# Patient Record
Sex: Female | Born: 1945 | Race: Black or African American | Hispanic: No | Marital: Married | Smoking: Never smoker
Health system: Southern US, Community
[De-identification: ages and names within clinical notes are randomized; demographics above are authoritative.]

## PROBLEM LIST (undated history)

## (undated) DIAGNOSIS — I10 Essential (primary) hypertension: Secondary | ICD-10-CM

## (undated) HISTORY — PX: CHOLECYSTECTOMY: SHX55

## (undated) HISTORY — PX: ABDOMINAL HYSTERECTOMY: SHX81

---

## 2013-11-10 DIAGNOSIS — M810 Age-related osteoporosis without current pathological fracture: Secondary | ICD-10-CM | POA: Insufficient documentation

## 2013-11-10 DIAGNOSIS — M5136 Other intervertebral disc degeneration, lumbar region: Secondary | ICD-10-CM | POA: Insufficient documentation

## 2013-11-10 DIAGNOSIS — M47814 Spondylosis without myelopathy or radiculopathy, thoracic region: Secondary | ICD-10-CM | POA: Insufficient documentation

## 2014-02-02 DIAGNOSIS — K8020 Calculus of gallbladder without cholecystitis without obstruction: Secondary | ICD-10-CM | POA: Insufficient documentation

## 2014-02-02 DIAGNOSIS — H43819 Vitreous degeneration, unspecified eye: Secondary | ICD-10-CM | POA: Insufficient documentation

## 2014-02-02 DIAGNOSIS — H527 Unspecified disorder of refraction: Secondary | ICD-10-CM | POA: Insufficient documentation

## 2014-02-02 DIAGNOSIS — H40019 Open angle with borderline findings, low risk, unspecified eye: Secondary | ICD-10-CM | POA: Insufficient documentation

## 2014-02-02 DIAGNOSIS — IMO0002 Reserved for concepts with insufficient information to code with codable children: Secondary | ICD-10-CM

## 2014-02-02 DIAGNOSIS — L84 Corns and callosities: Secondary | ICD-10-CM | POA: Insufficient documentation

## 2014-02-02 DIAGNOSIS — B351 Tinea unguium: Secondary | ICD-10-CM | POA: Insufficient documentation

## 2014-02-02 DIAGNOSIS — M722 Plantar fascial fibromatosis: Secondary | ICD-10-CM | POA: Insufficient documentation

## 2014-02-02 DIAGNOSIS — H15109 Unspecified episcleritis, unspecified eye: Secondary | ICD-10-CM | POA: Insufficient documentation

## 2014-02-02 DIAGNOSIS — R079 Chest pain, unspecified: Secondary | ICD-10-CM | POA: Insufficient documentation

## 2014-02-02 DIAGNOSIS — M2040 Other hammer toe(s) (acquired), unspecified foot: Secondary | ICD-10-CM

## 2014-02-02 DIAGNOSIS — M25559 Pain in unspecified hip: Secondary | ICD-10-CM | POA: Insufficient documentation

## 2014-02-02 DIAGNOSIS — H2511 Age-related nuclear cataract, right eye: Secondary | ICD-10-CM | POA: Insufficient documentation

## 2014-02-02 HISTORY — DX: Age-related nuclear cataract, right eye: H25.11

## 2014-02-02 HISTORY — DX: Reserved for concepts with insufficient information to code with codable children: IMO0002

## 2014-02-02 HISTORY — DX: Other hammer toe(s) (acquired), unspecified foot: M20.40

## 2014-03-14 DIAGNOSIS — M1712 Unilateral primary osteoarthritis, left knee: Secondary | ICD-10-CM

## 2014-03-14 DIAGNOSIS — H43819 Vitreous degeneration, unspecified eye: Secondary | ICD-10-CM | POA: Insufficient documentation

## 2014-03-14 HISTORY — DX: Unilateral primary osteoarthritis, left knee: M17.12

## 2015-01-27 DIAGNOSIS — M5416 Radiculopathy, lumbar region: Secondary | ICD-10-CM | POA: Insufficient documentation

## 2015-01-27 HISTORY — DX: Radiculopathy, lumbar region: M54.16

## 2016-07-20 DIAGNOSIS — M4800 Spinal stenosis, site unspecified: Secondary | ICD-10-CM | POA: Insufficient documentation

## 2016-07-21 ENCOUNTER — Emergency Department (HOSPITAL_BASED_OUTPATIENT_CLINIC_OR_DEPARTMENT_OTHER): Payer: Medicare Other

## 2016-07-21 ENCOUNTER — Emergency Department (HOSPITAL_BASED_OUTPATIENT_CLINIC_OR_DEPARTMENT_OTHER)
Admission: EM | Admit: 2016-07-21 | Discharge: 2016-07-21 | Disposition: A | Discharge status: Home / Self Care | Payer: Medicare Other | Attending: Emergency Medicine | Admitting: Emergency Medicine

## 2016-07-21 ENCOUNTER — Encounter (HOSPITAL_BASED_OUTPATIENT_CLINIC_OR_DEPARTMENT_OTHER): Payer: Self-pay | Admitting: *Deleted

## 2016-07-21 DIAGNOSIS — Y92009 Unspecified place in unspecified non-institutional (private) residence as the place of occurrence of the external cause: Secondary | ICD-10-CM | POA: Diagnosis not present

## 2016-07-21 DIAGNOSIS — Y9301 Activity, walking, marching and hiking: Secondary | ICD-10-CM | POA: Diagnosis not present

## 2016-07-21 DIAGNOSIS — I10 Essential (primary) hypertension: Secondary | ICD-10-CM | POA: Diagnosis not present

## 2016-07-21 DIAGNOSIS — S0990XA Unspecified injury of head, initial encounter: Secondary | ICD-10-CM

## 2016-07-21 DIAGNOSIS — Y999 Unspecified external cause status: Secondary | ICD-10-CM | POA: Insufficient documentation

## 2016-07-21 DIAGNOSIS — W109XXA Fall (on) (from) unspecified stairs and steps, initial encounter: Secondary | ICD-10-CM | POA: Insufficient documentation

## 2016-07-21 DIAGNOSIS — Z7982 Long term (current) use of aspirin: Secondary | ICD-10-CM | POA: Diagnosis not present

## 2016-07-21 DIAGNOSIS — Z79899 Other long term (current) drug therapy: Secondary | ICD-10-CM | POA: Diagnosis not present

## 2016-07-21 DIAGNOSIS — S0083XA Contusion of other part of head, initial encounter: Secondary | ICD-10-CM | POA: Diagnosis not present

## 2016-07-21 DIAGNOSIS — W19XXXA Unspecified fall, initial encounter: Secondary | ICD-10-CM

## 2016-07-21 HISTORY — DX: Essential (primary) hypertension: I10

## 2016-07-21 NOTE — ED Triage Notes (Signed)
She slipped and fell this am while going up steps into her house. She hit her face on a brick wall breaking her glasses. Abrasion above her right eye. No LOC.

## 2016-07-21 NOTE — ED Provider Notes (Signed)
[facility]   CSN: [id number] Arrival date & time: 07/21/16  1306     History   Chief Complaint Chief Complaint  Patient presents with  . Fall  . Head Injury    HPI Shannon Hanson is a 71 y.o. female.  Pt reports to the ED with face and head pain s/p fall.  The pt said that she tripped going up the stairs, had something in her hands, and fell; hitting her face on the brick wall.  She had no loc.  She is not on blood thinners.  The glasses she was wearing broke.      Past Medical History:  Diagnosis Date  . Hypertension     There are no active problems to display for this patient.   Past Surgical History:  Procedure Laterality Date  . ABDOMINAL HYSTERECTOMY      OB History    No data available       Home Medications    Prior to Admission medications   Medication Sig Start Date End Date Taking? Authorizing Provider  Aspirin (ASPIR-81 PO) Take by mouth.   Yes Historical Provider, MD  Calcium Carbonate-Vitamin D (CALCIUM 500 + D) 500-125 MG-UNIT TABS Take by mouth.   Yes Historical Provider, MD  FIBER COMPLETE PO Take by mouth.   Yes Historical Provider, MD  HYDROCHLOROTHIAZIDE PO Take by mouth.   Yes Historical Provider, MD  metoprolol (LOPRESSOR) 50 MG tablet Take 50 mg by mouth 2 (two) times daily.   Yes Historical Provider, MD  Potassium (POTASSIMIN PO) Take by mouth.   Yes Historical Provider, MD    Family History No family history on file.  Social History Social History  Substance Use Topics  . Smoking status: Never Smoker  . Smokeless tobacco: Never Used  . Alcohol use No     Allergies   Shellfish allergy   Review of Systems Review of Systems  HENT: Positive for facial swelling.   Neurological: Positive for headaches.  All other systems reviewed and are negative.    Physical Exam Updated Vital Signs BP 141/75   Pulse (!) 55   Temp 97.7 F (36.5 C) (Oral)   Resp 20   Ht 5\' 7"  (1.702 m)   Wt 191 lb (86.6  kg)   SpO2 99%   BMI 29.91 kg/m   Physical Exam  Constitutional: She is oriented to person, place, and time. She appears well-developed and well-nourished.  HENT:  Head: Normocephalic.    Right Ear: External ear normal.  Left Ear: External ear normal.  Nose: Nose normal.  Mouth/Throat: Oropharynx is clear and moist.  Eyes: Conjunctivae and EOM are normal. Pupils are equal, round, and reactive to light.  Neck: Normal range of motion. Neck supple.  Cardiovascular: Normal rate, regular rhythm, normal heart sounds and intact distal pulses.   Pulmonary/Chest: Effort normal and breath sounds normal.  Abdominal: Soft. Bowel sounds are normal.  Musculoskeletal: Normal range of motion.  Neurological: She is alert and oriented to person, place, and time.  Skin: Skin is warm.  Psychiatric: She has a normal mood and affect. Her behavior is normal. Judgment and thought content normal.  Nursing note and vitals reviewed.    ED Treatments / Results  Labs (all labs ordered are listed, but only abnormal results are displayed) Labs Reviewed - No data to display  EKG  EKG Interpretation None       Radiology Ct Head Wo Contrast  Result Date: 07/21/2016 CLINICAL DATA:  Stomal walking in 2 her home hitting right eye on brick wall. EXAM: CT HEAD WITHOUT CONTRAST CT MAXILLOFACIAL WITHOUT CONTRAST TECHNIQUE: Multidetector CT imaging of the head and maxillofacial structures were performed using the standard protocol without intravenous contrast. Multiplanar CT image reconstructions of the maxillofacial structures were also generated. COMPARISON:  None. FINDINGS: CT HEAD FINDINGS Brain: The ventricles, cisterns and other CSF spaces are within normal. There is no mass, mass effect, shift of midline structures or acute hemorrhage. There is no evidence of acute infarction. Vascular: Mild calcified plaque over the cavernous segment of the internal carotid arteries. Skull: Within normal. Other: None. CT  MAXILLOFACIAL FINDINGS Osseous: No evidence of fracture. Orbits: Globes and retrobulbar spaces are normal and symmetric. Subtle soft tissue swelling over the right periorbital region. Sinuses: Paranasal sinuses are well developed and well aerated without air-fluid level. Ostiomeatal complexes are patent. Visualized mastoid air cells are clear. Soft tissues: Minimal soft tissue swelling over the right periorbital region IMPRESSION: No acute intracranial findings. No acute facial bone fracture. Minimal soft tissue swelling over the right periorbital region. Electronically Signed   By: Elberta Fortisaniel  Boyle M.D.   On: 07/21/2016 14:30   Ct Maxillofacial Wo Contrast  Result Date: 07/21/2016 CLINICAL DATA:  Stomal walking in 2 her home hitting right eye on brick wall. EXAM: CT HEAD WITHOUT CONTRAST CT MAXILLOFACIAL WITHOUT CONTRAST TECHNIQUE: Multidetector CT imaging of the head and maxillofacial structures were performed using the standard protocol without intravenous contrast. Multiplanar CT image reconstructions of the maxillofacial structures were also generated. COMPARISON:  None. FINDINGS: CT HEAD FINDINGS Brain: The ventricles, cisterns and other CSF spaces are within normal. There is no mass, mass effect, shift of midline structures or acute hemorrhage. There is no evidence of acute infarction. Vascular: Mild calcified plaque over the cavernous segment of the internal carotid arteries. Skull: Within normal. Other: None. CT MAXILLOFACIAL FINDINGS Osseous: No evidence of fracture. Orbits: Globes and retrobulbar spaces are normal and symmetric. Subtle soft tissue swelling over the right periorbital region. Sinuses: Paranasal sinuses are well developed and well aerated without air-fluid level. Ostiomeatal complexes are patent. Visualized mastoid air cells are clear. Soft tissues: Minimal soft tissue swelling over the right periorbital region IMPRESSION: No acute intracranial findings. No acute facial bone fracture.  Minimal soft tissue swelling over the right periorbital region. Electronically Signed   By: Elberta Fortisaniel  Boyle M.D.   On: 07/21/2016 14:30    Procedures Procedures (including critical care time)  Medications Ordered in ED Medications - No data to display   Initial Impression / Assessment and Plan / ED Course  I have reviewed the triage vital signs and the nursing notes.  Pertinent labs & imaging results that were available during my care of the patient were reviewed by me and considered in my medical decision making (see chart for details).    Pt looks good.  She is stable for d/c.  She knows to return if worse.  Final Clinical Impressions(s) / ED Diagnoses   Final diagnoses:  Injury of head, initial encounter  Contusion of face, initial encounter  Fall in home, initial encounter    New Prescriptions New Prescriptions   No medications on file     Jacalyn LefevreJulie Antoinne Spadaccini, MD 07/21/16 1439

## 2016-07-21 NOTE — ED Notes (Signed)
ED Provider at bedside. 

## 2016-10-22 ENCOUNTER — Encounter (HOSPITAL_BASED_OUTPATIENT_CLINIC_OR_DEPARTMENT_OTHER): Payer: Self-pay

## 2016-10-22 ENCOUNTER — Emergency Department (HOSPITAL_BASED_OUTPATIENT_CLINIC_OR_DEPARTMENT_OTHER)
Admission: EM | Admit: 2016-10-22 | Discharge: 2016-10-23 | Disposition: A | Discharge status: Home / Self Care | Payer: Medicare Other | Attending: Emergency Medicine | Admitting: Emergency Medicine

## 2016-10-22 ENCOUNTER — Emergency Department (HOSPITAL_BASED_OUTPATIENT_CLINIC_OR_DEPARTMENT_OTHER): Payer: Medicare Other

## 2016-10-22 DIAGNOSIS — R1031 Right lower quadrant pain: Secondary | ICD-10-CM | POA: Diagnosis present

## 2016-10-22 DIAGNOSIS — I10 Essential (primary) hypertension: Secondary | ICD-10-CM | POA: Insufficient documentation

## 2016-10-22 DIAGNOSIS — Z7982 Long term (current) use of aspirin: Secondary | ICD-10-CM | POA: Diagnosis not present

## 2016-10-22 DIAGNOSIS — K2900 Acute gastritis without bleeding: Secondary | ICD-10-CM | POA: Insufficient documentation

## 2016-10-22 LAB — COMPREHENSIVE METABOLIC PANEL
ALBUMIN: 4.6 g/dL (ref 3.5–5.0)
ALT: 19 U/L (ref 14–54)
ANION GAP: 12 (ref 5–15)
AST: 33 U/L (ref 15–41)
Alkaline Phosphatase: 78 U/L (ref 38–126)
BUN: 19 mg/dL (ref 6–20)
CO2: 26 mmol/L (ref 22–32)
Calcium: 9.7 mg/dL (ref 8.9–10.3)
Chloride: 102 mmol/L (ref 101–111)
Creatinine, Ser: 0.84 mg/dL (ref 0.44–1.00)
GFR calc Af Amer: >60 mL/min (ref >60)
GFR calc non Af Amer: >60 mL/min (ref >60)
GLUCOSE: 138 mg/dL — AB (ref 65–99)
POTASSIUM: 3.4 mmol/L — AB (ref 3.5–5.1)
SODIUM: 140 mmol/L (ref 135–145)
Total Bilirubin: 1.1 mg/dL (ref 0.3–1.2)
Total Protein: 7.9 g/dL (ref 6.5–8.1)

## 2016-10-22 LAB — CBC WITH DIFFERENTIAL/PLATELET
BASOS ABS: 0 10*3/uL (ref 0.0–0.1)
BASOS PCT: 0 %
EOS ABS: 0 10*3/uL (ref 0.0–0.7)
Eosinophils Relative: 0 %
HCT: 40.2 % (ref 36.0–46.0)
HEMOGLOBIN: 14.5 g/dL (ref 12.0–15.0)
Lymphocytes Relative: 4 %
Lymphs Abs: 0.4 10*3/uL — ABNORMAL LOW (ref 0.7–4.0)
MCH: 29.7 pg (ref 26.0–34.0)
MCHC: 36.1 g/dL — AB (ref 30.0–36.0)
MCV: 82.4 fL (ref 78.0–100.0)
MONO ABS: 0.4 10*3/uL (ref 0.1–1.0)
MONOS PCT: 3 %
NEUTROS PCT: 93 %
Neutro Abs: 10.2 10*3/uL — ABNORMAL HIGH (ref 1.7–7.7)
Platelets: 270 10*3/uL (ref 150–400)
RBC: 4.88 MIL/uL (ref 3.87–5.11)
RDW: 13.7 % (ref 11.5–15.5)
WBC: 11 10*3/uL — ABNORMAL HIGH (ref 4.0–10.5)

## 2016-10-22 LAB — LIPASE, BLOOD: Lipase: 21 U/L (ref 11–51)

## 2016-10-22 MED ORDER — ONDANSETRON HCL 4 MG/2ML IJ SOLN
4.0000 mg | Freq: Once | INTRAMUSCULAR | Status: AC
  Administered 2016-10-22: 4 mg via INTRAVENOUS
  Filled 2016-10-22: qty 2

## 2016-10-22 MED ORDER — SODIUM CHLORIDE 0.9 % IV BOLUS (SEPSIS)
1000.0000 mL | Freq: Once | INTRAVENOUS | Status: AC
  Administered 2016-10-22: 1000 mL via INTRAVENOUS

## 2016-10-22 MED ORDER — IOPAMIDOL (ISOVUE-300) INJECTION 61%
100.0000 mL | Freq: Once | INTRAVENOUS | Status: AC | PRN
  Administered 2016-10-23: 100 mL via INTRAVENOUS

## 2016-10-22 NOTE — ED Provider Notes (Signed)
11:12 PM I was signed out patient as pending CT for evaluation of RLQ abdominal pain.  Labs are unremarkable.  CT is pending.   1:10 AM CT is negative for acute pathology.  Repeat abdominal exam does not reveal any tenderness.  Patient states the zofran took away her nausea and pain.  Perhaps this is a gastritis from something she ate.  Will DC home with zofran to take as needed.  PCP fu advised within 3 days. She demonstrates good understanding of the plan. She appears well and in NAD. VS remain within her normal limits and she is safe for Dc.   Tomasita Crumble, MD 10/23/16 0111

## 2016-10-22 NOTE — ED Triage Notes (Addendum)
Pt reports nausea, vomiting since 1600 with associated generalized abdominal discomfort, worsening tenderness to RLQ. NPO since 1600.

## 2016-10-22 NOTE — ED Notes (Addendum)
Back to room by w/c. Alert, NAD, calm, interactive, resps e/u, no dyspnea noted. Family x4 at [facility].

## 2016-10-22 NOTE — ED Notes (Signed)
Pt up to w/c to go to b/r to attempt urine sample. EDP into room, prior to RN assessment, see MD notes, pending orders.

## 2016-10-22 NOTE — ED Provider Notes (Signed)
[facility]   CSN: [id number] Arrival date & time: 10/22/16  2154   By signing my name below, I, Teofilo Pod, attest that this documentation has been prepared under the direction and in the presence of Gwyneth Sprout, MD . Electronically Signed: Teofilo Pod, ED Scribe. 10/22/2016. 10:21 PM.   History   Chief Complaint Chief Complaint  Patient presents with  . Emesis    The history is provided by the patient. No language interpreter was used.   HPI Comments:  Shannon Hanson is a 71 y.o. female who presents to the Emergency Department complaining of multiple episodes of vomiting today. Pt reports that she has vomited 6 times since 1600 today. She has not eaten or had fluids since this morning. Pt complains of associated nausea, abdominal pain the is primarily on the right side radiating across her abdomen. Pt had an endoscopy 2 months ago and was told that she had irritation in her stomach. She reports sick contact with a family member who had a stomach virus last week. She reports surgical hx of hysterectomy. She denies any recent medication changes, and she takes nexium regularly. She reports an allergy to gabapentin. No alleviating factors noted. Denies diarrhea, fever, urinary symptoms, SOB, back pain.    Past Medical History:  Diagnosis Date  . Hypertension     There are no active problems to display for this patient.   Past Surgical History:  Procedure Laterality Date  . ABDOMINAL HYSTERECTOMY      OB History    No data available       Home Medications    Prior to Admission medications   Medication Sig Start Date End Date Taking? Authorizing Provider  Aspirin (ASPIR-81 PO) Take by mouth.   Yes Historical Provider, MD  Calcium Carbonate-Vitamin D (CALCIUM 500 + D) 500-125 MG-UNIT TABS Take by mouth.   Yes Historical Provider, MD  FIBER COMPLETE PO Take by mouth.   Yes Historical Provider, MD  HYDROCHLOROTHIAZIDE PO Take by  mouth.   Yes Historical Provider, MD  metoprolol (LOPRESSOR) 50 MG tablet Take 50 mg by mouth 2 (two) times daily.   Yes Historical Provider, MD  Potassium (POTASSIMIN PO) Take by mouth.   Yes Historical Provider, MD    Family History History reviewed. No pertinent family history.  Social History Social History  Substance Use Topics  . Smoking status: Never Smoker  . Smokeless tobacco: Never Used  . Alcohol use No     Allergies   Gabapentin; Lyrica [pregabalin]; and Shellfish allergy   Review of Systems Review of Systems All systems reviewed and are negative for acute change except as noted in the HPI.   Physical Exam Updated Vital Signs BP 124/62 (BP Location: Right Arm)   Pulse 71   Temp 97.9 F (36.6 C) (Oral)   Resp 19   SpO2 100%   Physical Exam  Constitutional: She appears well-developed and well-nourished. No distress.  HENT:  Head: Normocephalic and atraumatic.  Dry mucous membranes.  Eyes: Conjunctivae are normal.  Neck: Neck supple.  Cardiovascular: Normal rate and regular rhythm.   No murmur heard. Pulmonary/Chest: Effort normal and breath sounds normal. No respiratory distress.  Abdominal: Soft. She exhibits no distension. There is tenderness. There is no rebound and no guarding.  RLQ pain without rebound or guarding. Decreased bowel sounds. No CVA tenderness.  Musculoskeletal: She exhibits no edema.  Neurological: She is alert.  Skin: Skin is warm and dry.  Psychiatric:  She has a normal mood and affect.  Nursing note and vitals reviewed.    ED Treatments / Results  DIAGNOSTIC STUDIES:  Oxygen Saturation is 100% on RA, normal by my interpretation.    COORDINATION OF CARE:  10:20 PM Discussed treatment plan with pt at bedside and pt agreed to plan.   Labs (all labs ordered are listed, but only abnormal results are displayed) Labs Reviewed - No data to display  EKG  EKG Interpretation None       Radiology No results  found.  Procedures Procedures (including critical care time)  Medications Ordered in ED Medications - No data to display   Initial Impression / Assessment and Plan / ED Course  I have reviewed the triage vital signs and the nursing notes.  Pertinent labs & imaging results that were available during my care of the patient were reviewed by me and considered in my medical decision making (see chart for details).     Patient is a 71 year old female presenting today with sudden onset of nausea vomiting around 3:00 this afternoon. She has vomited approximately 6 times and feels generally weak and worn out. She is also develop pain in the right side of her abdomen that radiates to the left. She denies any cardiac or respiratory symptoms.  No fever or diarrhea. Vital signs are within normal limits. She denies any urinary symptoms. On exam she does have right lower quadrant pain without rebound or guarding, decreased bowel sounds but no CVA tenderness. Possibly kidney stone versus small bowel obstruction versus appendicitis versus viral etiology. Patient given IV fluids and Zofran. CBC, CMP, lipase, urine pending. Patient will most likely need a CT scan performed for the evaluation.  Pt checked out to Dr. Mora Bellman  Final Clinical Impressions(s) / ED Diagnoses   Final diagnoses:  None    New Prescriptions New Prescriptions   No medications on file   I personally performed the services described in this documentation, which was scribed in my presence.  The recorded information has been reviewed and considered.     Gwyneth Sprout, MD 10/25/16 [phone]

## 2016-10-23 DIAGNOSIS — K2900 Acute gastritis without bleeding: Secondary | ICD-10-CM | POA: Diagnosis not present

## 2016-10-23 MED ORDER — ONDANSETRON 4 MG PO TBDP: 4.0000 mg | ORAL_TABLET | Freq: Three times a day (TID) | ORAL | 0 refills | Status: DC | PRN

## 2016-10-23 NOTE — ED Notes (Signed)
Back from CT, no changes, alert, NAD, calm.  ?

## 2016-10-23 NOTE — ED Notes (Signed)
Pt in to CT, no changes, alert, NAD, calm, VSS.

## 2016-10-23 NOTE — ED Notes (Signed)
Dr. Mora Bellman in to see and update pt/family.

## 2016-10-23 NOTE — ED Notes (Signed)
Resting comfortably, NAD, calm, tolerating PO contrast, denies nausea, mentions some "mild" abd discomfort, and rates a 6/10. VSS. Family x3 at [facility].

## 2017-10-30 ENCOUNTER — Other Ambulatory Visit: Payer: Self-pay

## 2017-10-30 ENCOUNTER — Encounter (HOSPITAL_BASED_OUTPATIENT_CLINIC_OR_DEPARTMENT_OTHER): Payer: Self-pay | Admitting: *Deleted

## 2017-10-30 DIAGNOSIS — R1013 Epigastric pain: Secondary | ICD-10-CM | POA: Diagnosis not present

## 2017-10-30 DIAGNOSIS — R1011 Right upper quadrant pain: Secondary | ICD-10-CM | POA: Diagnosis present

## 2017-10-30 DIAGNOSIS — R112 Nausea with vomiting, unspecified: Secondary | ICD-10-CM | POA: Insufficient documentation

## 2017-10-30 DIAGNOSIS — I10 Essential (primary) hypertension: Secondary | ICD-10-CM | POA: Diagnosis not present

## 2017-10-30 DIAGNOSIS — Z7982 Long term (current) use of aspirin: Secondary | ICD-10-CM | POA: Insufficient documentation

## 2017-10-30 LAB — CBC
HCT: 40.4 % (ref 36.0–46.0)
HEMOGLOBIN: 15 g/dL (ref 12.0–15.0)
MCH: 29.9 pg (ref 26.0–34.0)
MCHC: 37.1 g/dL — AB (ref 30.0–36.0)
MCV: 80.6 fL (ref 78.0–100.0)
PLATELETS: 273 10*3/uL (ref 150–400)
RBC: 5.01 MIL/uL (ref 3.87–5.11)
RDW: 13.7 % (ref 11.5–15.5)
WBC: 7.4 10*3/uL (ref 4.0–10.5)

## 2017-10-30 LAB — COMPREHENSIVE METABOLIC PANEL
ALT: 14 U/L (ref 14–54)
ANION GAP: 11 (ref 5–15)
AST: 29 U/L (ref 15–41)
Albumin: 4.7 g/dL (ref 3.5–5.0)
Alkaline Phosphatase: 79 U/L (ref 38–126)
BILIRUBIN TOTAL: 1.3 mg/dL — AB (ref 0.3–1.2)
BUN: 16 mg/dL (ref 6–20)
CALCIUM: 9.7 mg/dL (ref 8.9–10.3)
CO2: 27 mmol/L (ref 22–32)
CREATININE: 0.69 mg/dL (ref 0.44–1.00)
Chloride: 101 mmol/L (ref 101–111)
GFR calc non Af Amer: >60 mL/min (ref >60)
GLUCOSE: 109 mg/dL — AB (ref 65–99)
Potassium: 3.6 mmol/L (ref 3.5–5.1)
Sodium: 139 mmol/L (ref 135–145)
TOTAL PROTEIN: 7.8 g/dL (ref 6.5–8.1)

## 2017-10-30 LAB — LIPASE, BLOOD: Lipase: 25 U/L (ref 11–51)

## 2017-10-30 MED ORDER — ONDANSETRON 4 MG PO TBDP
4.0000 mg | ORAL_TABLET | Freq: Once | ORAL | Status: AC
  Administered 2017-10-30: 4 mg via ORAL
  Filled 2017-10-30: qty 1

## 2017-10-30 NOTE — ED Triage Notes (Signed)
Abdominal and back pain with vomiting since this am. States last year she had the same thing as today and she had an endoscopy that showed inflammation in her stomach lining. She took Nexium and got better.

## 2017-10-31 ENCOUNTER — Emergency Department (HOSPITAL_BASED_OUTPATIENT_CLINIC_OR_DEPARTMENT_OTHER)
Admission: EM | Admit: 2017-10-31 | Discharge: 2017-10-31 | Disposition: A | Discharge status: Home / Self Care | Payer: Medicare Other | Attending: Emergency Medicine | Admitting: Emergency Medicine

## 2017-10-31 DIAGNOSIS — R1013 Epigastric pain: Secondary | ICD-10-CM

## 2017-10-31 DIAGNOSIS — R1115 Cyclical vomiting syndrome unrelated to migraine: Secondary | ICD-10-CM

## 2017-10-31 LAB — URINALYSIS, ROUTINE W REFLEX MICROSCOPIC
BILIRUBIN URINE: NEGATIVE
Glucose, UA: NEGATIVE mg/dL
Hgb urine dipstick: NEGATIVE
Ketones, ur: 15 mg/dL — AB
Nitrite: NEGATIVE
PH: 8 (ref 5.0–8.0)
Protein, ur: 100 mg/dL — AB
SPECIFIC GRAVITY, URINE: 1.02 (ref 1.005–1.030)

## 2017-10-31 LAB — URINALYSIS, MICROSCOPIC (REFLEX)

## 2017-10-31 MED ORDER — ONDANSETRON 8 MG PO TBDP: 8.0000 mg | ORAL_TABLET | Freq: Three times a day (TID) | ORAL | 0 refills | Status: DC | PRN

## 2017-10-31 NOTE — Discharge Instructions (Addendum)
Please call your primary doctor later today.  You may benefit from an ultrasound of your upper abdomen in your gallbladder.  SEEK IMMEDIATE MEDICAL ATTENTION IF: The pain does not go away or becomes severe, particularly over the next 8-12 hours.  A temperature above 100.23F develops.  Repeated vomiting occurs (multiple episodes).  The pain becomes localized to portions of the abdomen. The right side could possibly be appendicitis. In an adult, the left lower portion of the abdomen could be colitis or diverticulitis.  Blood is being passed in stools or vomit (bright red or black tarry stools).  Return also if you develop chest pain, difficulty breathing, dizziness or fainting, or become confused, poorly responsive, or inconsolable.

## 2017-10-31 NOTE — ED Notes (Signed)
Pt unable to provide urine sample at this time 

## 2017-10-31 NOTE — ED Provider Notes (Signed)
[facility] EMERGENCY DEPARTMENT Provider Note   CSN: [id number] Arrival date & time: 10/30/17  2058     History   Chief Complaint Chief Complaint  Patient presents with  . Abdominal Pain    HPI Shannon Hanson is a 72 y.o. female.  The history is provided by the patient.  Abdominal Pain   This is a new problem. The current episode started 12 to 24 hours ago. The problem occurs constantly. The problem has been gradually improving. The pain is located in the epigastric region and RUQ. The pain is severe. Associated symptoms include nausea and vomiting. Pertinent negatives include fever, diarrhea, hematochezia, melena, constipation and dysuria. The symptoms are aggravated by eating. Relieved by: Zofran. Past workup includes GI consult and CT scan.  History of hypertension presents with abdominal pain and vomiting.  She reports multiple episodes of nonbloody emesis.  She also reports upper abdominal pain.  She reports the pain does radiate into her back at times No chest pain or shortness of breath is reported. No bloody or dark stools.  She has had normal bowel movements. Patient reports last year she had a similar episode, and had a followup with GI and was told that she had "stomach inflammation" She does report previous endoscopy  Past Medical History:  Diagnosis Date  . Hypertension     There are no active problems to display for this patient.   Past Surgical History:  Procedure Laterality Date  . ABDOMINAL HYSTERECTOMY       OB History   None      Home Medications    Prior to Admission medications   Medication Sig Start Date End Date Taking? Authorizing Provider  Aspirin (ASPIR-81 PO) Take by mouth.   Yes [provider]  Calcium Carbonate-Vitamin D (CALCIUM 500 + D) 500-125 MG-UNIT TABS Take by mouth.   Yes [provider]  FIBER COMPLETE PO Take by mouth.   Yes [provider]  HYDROCHLOROTHIAZIDE PO Take by mouth.   Yes  [provider]  metoprolol (LOPRESSOR) 50 MG tablet Take 50 mg by mouth 2 (two) times daily.   Yes [provider]  Potassium (POTASSIMIN PO) Take by mouth.   Yes [provider]  ondansetron (ZOFRAN ODT) 8 MG disintegrating tablet Take 1 tablet (8 mg total) by mouth every 8 (eight) hours as needed. 10/31/17   Zadie Rhine, MD    Family History No family history on file.  Social History Social History   Tobacco Use  . Smoking status: Never Smoker  . Smokeless tobacco: Never Used  Substance Use Topics  . Alcohol use: No  . Drug use: Not on file     Allergies   Gabapentin; Lyrica [pregabalin]; and Shellfish allergy   Review of Systems Review of Systems  Constitutional: Negative for fever.  Respiratory: Negative for shortness of breath.   Cardiovascular: Negative for chest pain.  Gastrointestinal: Positive for abdominal pain, nausea and vomiting. Negative for blood in stool, constipation, diarrhea, hematochezia and melena.  Genitourinary: Negative for difficulty urinating and dysuria.  All other systems reviewed and are negative.    Physical Exam Updated Vital Signs BP (!) 169/89   Pulse 64   Temp 98.3 F (36.8 C)   Resp 18   Ht 1.702 m ( )   Wt 72.6 kg (160 lb)   SpO2 99%   BMI 25.06 kg/m   Physical Exam CONSTITUTIONAL: Elderly, well-appearing, no acute distress HEAD: Normocephalic/atraumatic EYES: EOMI/PERRL, no icterus ENMT:  Mucous membranes moist NECK: supple no meningeal signs SPINE/BACK:entire spine nontender CV: S1/S2 noted, no murmurs/rubs/gallops noted LUNGS: Lungs are clear to auscultation bilaterally, no apparent distress ABDOMEN: soft, mild epigastric tenderness, no rebound or guarding, bowel sounds noted throughout abdomen GU:no cva tenderness NEURO: Pt is awake/alert/appropriate, moves all extremitiesx4.  EXTREMITIES:, full ROM SKIN: warm, color normal PSYCH: no abnormalities of mood noted, alert and oriented  to situation    ED Treatments / Results  Labs (all labs ordered are listed, but only abnormal results are displayed) Labs Reviewed  COMPREHENSIVE METABOLIC PANEL - Abnormal; Notable for the following components:      Result Value   Glucose, Bld 109 (*)    Total Bilirubin 1.3 (*)    All other components within normal limits  CBC - Abnormal; Notable for the following components:   MCHC 37.1 (*)    All other components within normal limits  URINALYSIS, ROUTINE W REFLEX MICROSCOPIC - Abnormal; Notable for the following components:   Ketones, ur 15 (*)    Protein, ur 100 (*)    Leukocytes, UA SMALL (*)    All other components within normal limits  URINALYSIS, MICROSCOPIC (REFLEX) - Abnormal; Notable for the following components:   Bacteria, UA MANY (*)    All other components within normal limits  LIPASE, BLOOD    EKG None  Radiology No results found.  Procedures Procedures (including critical care time)  Medications Ordered in ED Medications  ondansetron (ZOFRAN-ODT) disintegrating tablet 4 mg (4 mg Oral Given 10/30/17 2301)     Initial Impression / Assessment and Plan / ED Course  I have reviewed the triage vital signs and the nursing notes.  Pertinent labs results that were available during my care of the patient were reviewed by me and considered in my medical decision making (see chart for details).     Patient presents with abdominal pain and vomiting. By the time of my evaluation patient was feeling improved. She is awake alert, no distress.  No further vomiting after receiving Zofran. She prefers to go home, she feels this is similar to previous episodes.  She plans to restrict her diet and follow-up closely with her primary doctor. She requests Zofran at discharge.   We discussed strict ER return precautions including  fever >100.29F with repetitive vomiting over next 8-12 hours   I told her that she may benefit from having right upper quadrant abdominal  ultrasound by her PCP Final Clinical Impressions(s) / ED Diagnoses   Final diagnoses:  Epigastric pain  Non-intractable cyclical vomiting with nausea    ED Discharge Orders        Ordered    ondansetron (ZOFRAN ODT) 8 MG disintegrating tablet  Every 8 hours PRN     10/31/17 0312       Zadie Rhine, MD 10/31/17 [phone]

## 2018-01-31 DIAGNOSIS — Z9049 Acquired absence of other specified parts of digestive tract: Secondary | ICD-10-CM

## 2018-01-31 HISTORY — DX: Acquired absence of other specified parts of digestive tract: Z90.49

## 2019-05-30 ENCOUNTER — Encounter: Payer: Self-pay | Admitting: Cardiology

## 2019-06-17 ENCOUNTER — Other Ambulatory Visit: Payer: Self-pay

## 2019-06-19 ENCOUNTER — Other Ambulatory Visit: Payer: Self-pay

## 2019-06-19 ENCOUNTER — Ambulatory Visit (INDEPENDENT_AMBULATORY_CARE_PROVIDER_SITE_OTHER): Payer: Medicare Other | Admitting: Cardiology

## 2019-06-19 ENCOUNTER — Encounter: Payer: Self-pay | Admitting: Cardiology

## 2019-06-19 VITALS — BP 168/84 | HR 50 | Ht 67.0 in | Wt 175.0 lb

## 2019-06-19 DIAGNOSIS — R0789 Other chest pain: Secondary | ICD-10-CM | POA: Insufficient documentation

## 2019-06-19 DIAGNOSIS — R011 Cardiac murmur, unspecified: Secondary | ICD-10-CM

## 2019-06-19 DIAGNOSIS — I259 Chronic ischemic heart disease, unspecified: Secondary | ICD-10-CM

## 2019-06-19 DIAGNOSIS — I209 Angina pectoris, unspecified: Secondary | ICD-10-CM

## 2019-06-19 DIAGNOSIS — I495 Sick sinus syndrome: Secondary | ICD-10-CM | POA: Insufficient documentation

## 2019-06-19 DIAGNOSIS — R0989 Other specified symptoms and signs involving the circulatory and respiratory systems: Secondary | ICD-10-CM | POA: Diagnosis not present

## 2019-06-19 DIAGNOSIS — I10 Essential (primary) hypertension: Secondary | ICD-10-CM | POA: Insufficient documentation

## 2019-06-19 MED ORDER — AMLODIPINE BESYLATE 10 MG PO TABS: 10.0000 mg | ORAL_TABLET | Freq: Every day | ORAL | 3 refills | Status: DC

## 2019-06-19 MED ORDER — NITROGLYCERIN 0.4 MG SL SUBL: 0.4000 mg | SUBLINGUAL_TABLET | SUBLINGUAL | 3 refills | Status: DC | PRN

## 2019-06-19 NOTE — Progress Notes (Signed)
Cardiology Office Note:    Date:  06/19/2019   ID:  Shannon Hanson, DOB 10-Apr-1946, MRN [id number]  PCP:  Forrest Moron, MD  Cardiologist:  Garwin Brothers, MD   Referring MD: Forrest Moron, MD    ASSESSMENT:    1. Chest pain due to myocardial ischemia, unspecified ischemic chest pain type   2. Angina pectoris (HCC)   3. Renal bruit   4. Murmur   5. Chest discomfort   6. Essential hypertension   7. Tachycardia-bradycardia syndrome (HCC)   8. Abdominal bruit   9. Cardiac murmur    PLAN:    In order of problems listed above:  1. Chest discomfort: Primary prevention stressed with the patient.  Importance of compliance with diet and medication stressed and she vocalized understanding.  She has multiple risk factors for coronary artery disease although her chest pain is atypical we will do a Lexiscan sestamibi.  She knows to go to the nearest emergency room for any concerning symptoms.  Sublingual nitroglycerin prescription was sent, its protocol and 911 protocol explained and the patient vocalized understanding questions were answered to the patient's satisfaction 2. Essential hypertension: Blood pressure still elevated and continues to be so at home also we will double amlodipine to 10 mg daily.  After reviewing [facility] monitoring reports I will further titrate her medications.  Dietary issues were discussed with her at extensive length including salt intake and she promises to comply.  Echocardiogram will be done to assess murmur heard on auscultation. 3. Patient has abdominal bruit and we will do bilateral renal arterial Doppler to rule out stenosis in view of uncontrolled hypertension. 4. Patient will be seen in follow-up appointment in a month or earlier if she has any concerns.   Medication Adjustments/Labs and Tests Ordered: Current medicines are reviewed at length with the patient today.  Concerns regarding medicines are outlined above.  Orders Placed This Encounter    Procedures  . MYOCARDIAL PERFUSION IMAGING  . ECHOCARDIOGRAM COMPLETE  . VAS US RENAL ARTERY DUPLEX   Meds ordered this encounter  Medications  . nitroGLYCERIN (NITROSTAT) 0.4 MG SL tablet    Sig: Place 1 tablet (0.4 mg total) under the tongue every 5 (five) minutes as needed.    Dispense:  25 tablet    Refill:  3  . amLODipine (NORVASC) 10 MG tablet    Sig: Take 1 tablet (10 mg total) by mouth daily.    Dispense:  180 tablet    Refill:  3     History of Present Illness:    Shannon Hanson is a 73 y.o. female who is being seen today for the evaluation of chest discomfort and essential hypertension at the request of Forrest Moron, MD.  Patient is a pleasant 73 year old female.  She has past medical history of essential hypertension.  She is on multiple medications and her blood pressure is challenging to control.  She was undergoing monitoring which revealed bradycardia and tachyarrhythmias.  I do not have access to those reports and I am trying to get them from primary care physician's office.  She complains of chest discomfort at times not related to exertion.  He does not exercise on a regular basis.  Her chest discomfort does not radiate to the neck or to the arms.  At the time of my evaluation, the patient is alert awake oriented and in no distress.  Past Medical History:  Diagnosis Date  . Hypertension     Past Surgical History:  Procedure  Laterality Date  . ABDOMINAL HYSTERECTOMY      Current Medications: Current Meds  Medication Sig  . Aspirin (ASPIR-81 PO) Take by mouth.  . Calcium Carbonate-Vitamin D (CALCIUM 500 + D) 500-125 MG-UNIT TABS Take by mouth.  . cloNIDine (CATAPRES) 0.2 MG tablet Take 0.2 mg by mouth 2 (two) times daily.  Marland Kitchen. FIBER COMPLETE PO Take by mouth.  . fluticasone (FLONASE) 50 MCG/ACT nasal spray SMARTSIG:1 Spray(s) Both Nares 1 to 2 Times Daily PRN  . losartan (COZAAR) 100 MG tablet Take 100 mg by mouth at bedtime.  . Potassium (POTASSIMIN PO) Take  by mouth.  . timolol (TIMOPTIC) 0.5 % ophthalmic solution 1 drop 2 (two) times daily.  Marland Kitchen. triamterene-hydrochlorothiazide (MAXZIDE-25) 37.5-25 MG tablet Take 1 tablet by mouth daily.  . [DISCONTINUED] amLODipine (NORVASC) 5 MG tablet Take 5 mg by mouth daily.     Allergies:   Pregabalin, Shellfish allergy, and Gabapentin   Social History   Socioeconomic History  . Marital status: Married    Spouse name: Not on file  . Number of children: Not on file  . Years of education: Not on file  . Highest education level: Not on file  Occupational History  . Not on file  Tobacco Use  . Smoking status: Never Smoker  . Smokeless tobacco: Never Used  Substance and Sexual Activity  . Alcohol use: No  . Drug use: Never  . Sexual activity: Never  Other Topics Concern  . Not on file  Social History Narrative  . Not on file   Social Determinants of Health   Financial Resource Strain:   . Difficulty of Paying Living Expenses: Not on file  Food Insecurity:   . Worried About Programme researcher, broadcasting/film/videounning Out of Food in the Last Year: Not on file  . Ran Out of Food in the Last Year: Not on file  Transportation Needs:   . Lack of Transportation (Medical): Not on file  . Lack of Transportation (Non-Medical): Not on file  Physical Activity:   . Days of Exercise per Week: Not on file  . Minutes of Exercise per Session: Not on file  Stress:   . Feeling of Stress : Not on file  Social Connections:   . Frequency of Communication with Friends and Family: Not on file  . Frequency of Social Gatherings with Friends and Family: Not on file  . Attends Religious Services: Not on file  . Active Member of Clubs or Organizations: Not on file  . Attends BankerClub or Organization Meetings: Not on file  . Marital Status: Not on file     Family History: The patient's family history includes CAD in her father; Heart disease in her brother; Throat cancer in her brother and mother.  ROS:   Please see the history of present illness.     All other systems reviewed and are negative.  EKGs/Labs/Other Studies Reviewed:    The following studies were reviewed today: EKG reveals sinus rhythm and nonspecific ST changes   Recent Labs: No results found for requested labs within last 8760 hours.  Recent Lipid Panel No results found for: CHOL, TRIG, HDL, CHOLHDL, VLDL, LDLCALC, LDLDIRECT  Physical Exam:    VS:  BP (!) 168/84 (BP Location: Right Arm, Patient Position: Sitting, Cuff Size: Normal)   Pulse (!) 50   Ht 5\' 7"  (1.702 m)   Wt 175 lb (79.4 kg)   BMI 27.41 kg/m     Wt Readings from Last 3 Encounters:  06/19/19 175 lb (  79.4 kg)  10/30/17 160 lb (72.6 kg)  07/21/16 191 lb (86.6 kg)     GEN: Patient is in no acute distress HEENT: Normal NECK: No JVD; No carotid bruits LYMPHATICS: No lymphadenopathy CARDIAC: S1 S2 regular, 2/6 systolic murmur at the apex. RESPIRATORY:  Clear to auscultation without rales, wheezing or rhonchi  ABDOMEN: Soft, non-tender, non-distended MUSCULOSKELETAL:  No edema; No deformity  SKIN: Warm and dry NEUROLOGIC:  Alert and oriented x 3 PSYCHIATRIC:  Normal affect    Signed, Jenean Lindau, MD  06/19/2019 9:59 AM    [place]

## 2019-06-19 NOTE — Patient Instructions (Signed)
Medication Instructions:  Your physician has recommended you make the following change in your medication:   START taking amlodipine 10 mg (1 tablet) once daily  START taking nitroglycerin as needed for chest pain, When having chest pain, stop what you are doing and sit down. Take 1 nitro, wait 5 minutes. Still having chest pain, take 1 nitro, wait 5 minutes. Still having chest pain, take 1 nitro, dial 911. Total of 3 nitro in 15 minutes.  If you need a refill on your cardiac medications before your next appointment, please call your pharmacy.   Lab work: NONE If you have labs (blood work) drawn today and your tests are completely normal, you will receive your results only by: [facility] MyChart Message (if you have MyChart) OR . A paper copy in the mail If you have any lab test that is abnormal or we need to change your treatment, we will call you to review the results.  Testing/Procedures: You had an EKG performed today.  Your physician has requested that you have an echocardiogram. Echocardiography is a painless test that uses sound waves to create images of your heart. It provides your doctor with information about the size and shape of your heart and how well your heart's chambers and valves are working. This procedure takes approximately one hour. There are no restrictions for this procedure.  Your physician has requested that you have a lexiscan myoview. For further information please visit [email or web address] Please follow instruction sheet, as given.   Your physician has requested that you have a renal artery duplex. During this test, an ultrasound is used to evaluate blood flow to the kidneys. Allow one hour for this exam. Do not eat after midnight the day before and avoid carbonated beverages. Take your medications as you usually do.   Follow-Up: At [facility], you and your health needs are our priority.  As part of our continuing mission to provide you with exceptional heart care,  we have created designated Provider Care Teams.  These Care Teams include your primary Cardiologist (physician) and Advanced Practice Providers (APPs -  Physician Assistants and Nurse Practitioners) who all work together to provide you with the care you need, when you need it. You will need a follow up appointment in 1 months.   Any Other Special Instructions Will Be Listed Below  Regadenoson injection What is this medicine? REGADENOSON is used to test the heart for coronary artery disease. It is used in patients who can not exercise for their stress test. This medicine may be used for other purposes; ask your health care provider or pharmacist if you have questions. COMMON BRAND NAME(S): Lexiscan What should I tell my health care provider before I take this medicine? They need to know if you have any of these conditions:  heart problems  lung or breathing disease, like asthma or COPD  an unusual or allergic reaction to regadenoson, other medicines, foods, dyes, or preservatives  pregnant or trying to get pregnant  breast-feeding How should I use this medicine? This medicine is for injection into a vein. It is given by a health care professional in a hospital or clinic setting. Talk to your pediatrician regarding the use of this medicine in children. Special care may be needed. Overdosage: If you think you have taken too much of this medicine contact a poison control center or emergency room at once. NOTE: This medicine is only for you. Do not share this medicine with others. What if I miss a dose?  This does not apply. What may interact with this medicine?  caffeine  dipyridamole  guarana  theophylline This list may not describe all possible interactions. Give your health care provider a list of all the medicines, herbs, non-prescription drugs, or dietary supplements you use. Also tell them if you smoke, drink alcohol, or use illegal drugs. Some items may interact with your  medicine. What should I watch for while using this medicine? Your condition will be monitored carefully while you are receiving this medicine. Do not take medicines, foods, or drinks with caffeine (like coffee, tea, or colas) for at least 12 hours before your test. If you do not know if something contains caffeine, ask your health care professional. What side effects may I notice from receiving this medicine? Side effects that you should report to your doctor or health care professional as soon as possible:  allergic reactions like skin rash, itching or hives, swelling of the face, lips, or tongue  breathing problems  chest pain, tightness or palpitations  severe headache Side effects that usually do not require medical attention (report to your doctor or health care professional if they continue or are bothersome):  flushing  headache  irritation or pain at site where injected  nausea, vomiting This list may not describe all possible side effects. Call your doctor for medical advice about side effects. You may report side effects to FDA at 1-800-FDA-1088. Where should I keep my medicine? This drug is given in a hospital or clinic and will not be stored at home. NOTE: This sheet is a summary. It may not cover all possible information. If you have questions about this medicine, talk to your doctor, pharmacist, or health care provider.  2020 Elsevier/Gold Standard (2008-02-10 15:08:13)  Cardiac Nuclear Scan A cardiac nuclear scan is a test that is done to check the flow of blood to your heart. It is done when you are resting and when you are exercising. The test looks for problems such as:  Not enough blood reaching a portion of the heart.  The heart muscle not working as it should. You may need this test if:  You have heart disease.  You have had lab results that are not normal.  You have had heart surgery or a balloon procedure to open up blocked arteries  (angioplasty).  You have chest pain.  You have shortness of breath. In this test, a special dye (tracer) is put into your bloodstream. The tracer will travel to your heart. A camera will then take pictures of your heart to see how the tracer moves through your heart. This test is usually done at a hospital and takes 2-4 hours. Tell a doctor about:  Any allergies you have.  All medicines you are taking, including vitamins, herbs, eye drops, creams, and over-the-counter medicines.  Any problems you or family members have had with anesthetic medicines.  Any blood disorders you have.  Any surgeries you have had.  Any medical conditions you have.  Whether you are pregnant or may be pregnant. What are the risks? Generally, this is a safe test. However, problems may occur, such as:  Serious chest pain and heart attack. This is only a risk if the stress portion of the test is done.  Rapid heartbeat.  A feeling of warmth in your chest. This feeling usually does not last long.  Allergic reaction to the tracer. What happens before the test?  Ask your doctor about changing or stopping your normal medicines. This is  important.  Follow instructions from your doctor about what you cannot eat or drink.  Remove your jewelry on the day of the test. What happens during the test?  An IV tube will be inserted into one of your veins.  Your doctor will give you a small amount of tracer through the IV tube.  You will wait for 20-40 minutes while the tracer moves through your bloodstream.  Your heart will be monitored with an electrocardiogram (ECG).  You will lie down on an exam table.  Pictures of your heart will be taken for about 15-20 minutes.  You may also have a stress test. For this test, one of these things may be done: ? You will be asked to exercise on a treadmill or a stationary bike. ? You will be given medicines that will make your heart work harder. This is done if you are  unable to exercise.  When blood flow to your heart has peaked, a tracer will again be given through the IV tube.  After 20-40 minutes, you will get back on the exam table. More pictures will be taken of your heart.  Depending on the tracer that is used, more pictures may need to be taken 3-4 hours later.  Your IV tube will be removed when the test is over. The test may vary among doctors and hospitals. What happens after the test?  Ask your doctor: ? Whether you can return to your normal schedule, including diet, activities, and medicines. ? Whether you should drink more fluids. This will help to remove the tracer from your body. Drink enough fluid to keep your pee (urine) pale yellow.  Ask your doctor, or the department that is doing the test: ? When will my results be ready? ? How will I get my results? Summary  A cardiac nuclear scan is a test that is done to check the flow of blood to your heart.  Tell your doctor whether you are pregnant or may be pregnant.  Before the test, ask your doctor about changing or stopping your normal medicines. This is important.  Ask your doctor whether you can return to your normal activities. You may be asked to drink more fluids. This information is not intended to replace advice given to you by your health care provider. Make sure you discuss any questions you have with your health care provider. Document Released: 11/26/2017 Document Revised: 10/02/2018 Document Reviewed: 11/26/2017 Elsevier Patient Education  2020 [employer].  Echocardiogram An echocardiogram is a procedure that uses painless sound waves (ultrasound) to produce an image of the heart. Images from an echocardiogram can provide important information about:  Signs of coronary artery disease (CAD).  Aneurysm detection. An aneurysm is a weak or damaged part of an artery wall that bulges out from the normal force of blood pumping through the body.  Heart size and shape.  Changes in the size or shape of the heart can be associated with certain conditions, including heart failure, aneurysm, and CAD.  Heart muscle function.  Heart valve function.  Signs of a past heart attack.  Fluid buildup around the heart.  Thickening of the heart muscle.  A tumor or infectious growth around the heart valves. Tell a health care provider about:  Any allergies you have.  All medicines you are taking, including vitamins, herbs, eye drops, creams, and over-the-counter medicines.  Any blood disorders you have.  Any surgeries you have had.  Any medical conditions you have.  Whether you are  pregnant or may be pregnant. What are the risks? Generally, this is a safe procedure. However, problems may occur, including:  Allergic reaction to dye (contrast) that may be used during the procedure. What happens before the procedure? No specific preparation is needed. You may eat and drink normally. What happens during the procedure?   An IV tube may be inserted into one of your veins.  You may receive contrast through this tube. A contrast is an injection that improves the quality of the pictures from your heart.  A gel will be applied to your chest.  A wand-like tool (transducer) will be moved over your chest. The gel will help to transmit the sound waves from the transducer.  The sound waves will harmlessly bounce off of your heart to allow the heart images to be captured in real-time motion. The images will be recorded on a computer. The procedure may vary among health care providers and hospitals. What happens after the procedure?  You may return to your normal, everyday life, including diet, activities, and medicines, unless your health care provider tells you not to do that. Summary  An echocardiogram is a procedure that uses painless sound waves (ultrasound) to produce an image of the heart.  Images from an echocardiogram can provide important information  about the size and shape of your heart, heart muscle function, heart valve function, and fluid buildup around your heart.  You do not need to do anything to prepare before this procedure. You may eat and drink normally.  After the echocardiogram is completed, you may return to your normal, everyday life, unless your health care provider tells you not to do that. This information is not intended to replace advice given to you by your health care provider. Make sure you discuss any questions you have with your health care provider. Document Released: 06/09/2000 Document Revised: 10/03/2018 Document Reviewed: 07/15/2016 Elsevier Patient Education  2020 Elsevier Inc.  Renal Scan  A renal scan is a procedure that is used to check for problems in the kidneys. The kidneys are the organs that help filter blood and keep it clean. They move waste out of your blood and into your urine so it can be removed from the body. In this procedure, a small amount of radioactive material (tracer) is injected into your blood. The tracer will travel through your bloodstream and reach your kidneys. A scanner with a camera that detects the radioactive tracer is used to examine the kidneys. The tracer makes it easy for your health care provider to see problems, if there are any. There are several types of renal scans:  Renal structural scan. This type is used to check for problems that may change the normal structure of the kidney. These problems include cysts, tumors, abscesses, and certain disorders that are present at birth (congenital).  Renal function scan. This type is used to check for problems that affect kidney function, such as inflammation, low blood supply, or kidney failure.  Renal blood flow scan. This type is used to evaluate the blood flow to each kidney. It can help to identify any narrowing of the arteries that carry blood to the kidneys (renal artery stenosis).  Renal hypertension scan. This type is  used to identify the cause of high blood pressure that results from problems in the renal arteries (renovascular hypertension). It can help to identify any narrowing or blockages in these arteries.  Renal obstruction scan. This type is used to identify any obstruction in the area  where waste moves out of the kidney and into the urinary tract. Tell a health care provider about:  Any allergies you have.  All medicines you are taking, including vitamins, herbs, eye drops, creams, and over-the-counter medicines.  Any blood disorders you have.  Any surgeries you have had.  Any medical conditions you have.  If you are breastfeeding.  If you are pregnant or you think that you may be pregnant. What are the risks? Generally, this is a safe procedure. However, problems may occur, including:  Exposure to radiation (a small amount).  Allergic reaction to the radioactive material. This is rare. What happens before the procedure?  You may be asked to drink extra fluids for 24 hours before the exam. Follow your health care provider's instructions.  Ask your health care provider about: ? Changing or stopping your regular medicines. This is especially important if you are taking diabetes medicines or blood thinners. ? Taking medicines such as aspirin and ibuprofen. These medicines can thin your blood and affect kidney function. Do not take these medicines unless your health care provider tells you to take them. ? Taking over-the-counter medicines, vitamins, herbs, and supplements. What happens during the procedure?  You will lie on a scanner table.  An IV line will be inserted into one of your veins. The IV line will remain in place for the entire exam.  A small amount of radioactive tracer will be injected through the IV line.  Images will be taken of your kidney area. The images will be taken at different intervals depending on what is being checked.  In some cases, a second type of tracer  or a medicine may be injected. Then, more scans of the kidney will be done. The procedure may vary among health care providers and hospitals. What happens after the procedure?  Return to your normal activities and your normal diet as directed by your health care provider.  The radioactive tracer will leave your body over the next few days. Drink enough fluid to keep your urine pale yellow. This will help flush the tracer out of your body.  Ask your health care provider, or the department that is doing the test: ? When will my results be ready? ? How will I get my results? ? What are my treatment options? ? What other tests do I need? ? What are my next steps?  Talk with your health care provider about what your results mean. Summary  A renal scan is a procedure that is used to check for problems in the kidneys. The kidneys are the organs that help filter blood and keep it clean.  A renal scan can help check blood flow to the kidney and help look for cysts, tumors, or infection in the kidneys.  Tell your health care provider about any prescription or over-the-counter medicines, vitamins, herbs, and supplements that you are taking. Some of these can affect kidney function and may interfere with your test results.  Talk with your health care provider about what your results mean. This information is not intended to replace advice given to you by your health care provider. Make sure you discuss any questions you have with your health care provider. Document Released: 06/09/2000 Document Revised: 07/20/2017 Document Reviewed: 07/20/2017 Elsevier Patient Education  [employer]. Amlodipine tablets What is this medicine? AMLODIPINE (am LOE di peen) is a calcium-channel blocker. It affects the amount of calcium found in your heart and muscle cells. This relaxes your blood vessels, which  can reduce the amount of work the heart has to do. This medicine is used to lower high blood pressure.  It is also used to prevent chest pain. This medicine may be used for other purposes; ask your health care provider or pharmacist if you have questions. COMMON BRAND NAME(S): Norvasc What should I tell my health care provider before I take this medicine? They need to know if you have any of these conditions:  heart disease  liver disease  an unusual or allergic reaction to amlodipine, other medicines, foods, dyes, or preservatives  pregnant or trying to get pregnant  breast-feeding How should I use this medicine? Take this medicine by mouth with a glass of water. Follow the directions on the prescription label. You can take it with or without food. If it upsets your stomach, take it with food. Take your medicine at regular intervals. Do not take it more often than directed. Do not stop taking except on your doctor's advice. Talk to your pediatrician regarding the use of this medicine in children. While this drug may be prescribed for children as young as 6 years for selected conditions, precautions do apply. Patients over 33 years of age may have a stronger reaction and need a smaller dose. Overdosage: If you think you have taken too much of this medicine contact a poison control center or emergency room at once. NOTE: This medicine is only for you. Do not share this medicine with others. What if I miss a dose? If you miss a dose, take it as soon as you can. If it is almost time for your next dose, take only that dose. Do not take double or extra doses. What may interact with this medicine? Do not take this medicine with any of the following medications:  tranylcypromine This medicine may also interact with the following medications:  clarithromycin  cyclosporine  diltiazem  itraconazole  simvastatin  tacrolimus This list may not describe all possible interactions. Give your health care provider a list of all the medicines, herbs, non-prescription drugs, or dietary supplements  you use. Also tell them if you smoke, drink alcohol, or use illegal drugs. Some items may interact with your medicine. What should I watch for while using this medicine? Visit your healthcare professional for regular checks on your progress. Check your blood pressure as directed. Ask your healthcare professional what your blood pressure should be and when you should contact him or her. Do not treat yourself for coughs, colds, or pain while you are using this medicine without asking your healthcare professional for advice. Some medicines may increase your blood pressure. You may get dizzy. Do not drive, use machinery, or do anything that needs mental alertness until you know how this medicine affects you. Do not stand or sit up quickly, especially if you are an older patient. This reduces the risk of dizzy or fainting spells. Avoid alcoholic drinks; they can make you dizzier. What side effects may I notice from receiving this medicine? Side effects that you should report to your doctor or health care professional as soon as possible:  allergic reactions like skin rash, itching or hives; swelling of the face, lips, or tongue  fast, irregular heartbeat  signs and symptoms of low blood pressure like dizziness; feeling faint or lightheaded, falls; unusually weak or tired  swelling of ankles, feet, hands Side effects that usually do not require medical attention (report these to your doctor or health care professional if they continue or are bothersome):  dry mouth  facial flushing  headache  stomach pain  tiredness This list may not describe all possible side effects. Call your doctor for medical advice about side effects. You may report side effects to FDA at 1-800-FDA-1088. Where should I keep my medicine? Keep out of the reach of children. Store at room temperature between 59 and 86 degrees F (15 and 30 degrees C). Throw away any unused medicine after the expiration date. NOTE: This  sheet is a summary. It may not cover all possible information. If you have questions about this medicine, talk to your doctor, pharmacist, or health care provider.  2020 Elsevier/Gold Standard (2018-01-04 15:07:10) Nitroglycerin sublingual tablets What is this medicine? NITROGLYCERIN (nye troe GLI ser in) is a type of vasodilator. It relaxes blood vessels, increasing the blood and oxygen supply to your heart. This medicine is used to relieve chest pain caused by angina. It is also used to prevent chest pain before activities like climbing stairs, going outdoors in cold weather, or sexual activity. This medicine may be used for other purposes; ask your health care provider or pharmacist if you have questions. COMMON BRAND NAME(S): Nitroquick, Nitrostat, Nitrotab What should I tell my health care provider before I take this medicine? They need to know if you have any of these conditions:  anemia  head injury, recent stroke, or bleeding in the brain  liver disease  previous heart attack  an unusual or allergic reaction to nitroglycerin, other medicines, foods, dyes, or preservatives  pregnant or trying to get pregnant  breast-feeding How should I use this medicine? Take this medicine by mouth as needed. At the first sign of an angina attack (chest pain or tightness) place one tablet under your tongue. You can also take this medicine 5 to 10 minutes before an event likely to produce chest pain. Follow the directions on the prescription label. Let the tablet dissolve under the tongue. Do not swallow whole. Replace the dose if you accidentally swallow it. It will help if your mouth is not dry. Saliva around the tablet will help it to dissolve more quickly. Do not eat or drink, smoke or chew tobacco while a tablet is dissolving. If you are not better within 5 minutes after taking ONE dose of nitroglycerin, call 9-1-1 immediately to seek emergency medical care. Do not take more than 3 nitroglycerin  tablets over 15 minutes. If you take this medicine often to relieve symptoms of angina, your doctor or health care professional may provide you with different instructions to manage your symptoms. If symptoms do not go away after following these instructions, it is important to call 9-1-1 immediately. Do not take more than 3 nitroglycerin tablets over 15 minutes. Talk to your pediatrician regarding the use of this medicine in children. Special care may be needed. Overdosage: If you think you have taken too much of this medicine contact a poison control center or emergency room at once. NOTE: This medicine is only for you. Do not share this medicine with others. What if I miss a dose? This does not apply. This medicine is only used as needed. What may interact with this medicine? Do not take this medicine with any of the following medications:  certain migraine medicines like ergotamine and dihydroergotamine (DHE)  medicines used to treat erectile dysfunction like sildenafil, tadalafil, and vardenafil  riociguat This medicine may also interact with the following medications:  alteplase  aspirin  heparin  medicines for high blood pressure  medicines for mental depression  other medicines used to treat angina  phenothiazines like chlorpromazine, mesoridazine, prochlorperazine, thioridazine This list may not describe all possible interactions. Give your health care provider a list of all the medicines, herbs, non-prescription drugs, or dietary supplements you use. Also tell them if you smoke, drink alcohol, or use illegal drugs. Some items may interact with your medicine. What should I watch for while using this medicine? Tell your doctor or health care professional if you feel your medicine is no longer working. Keep this medicine with you at all times. Sit or lie down when you take your medicine to prevent falling if you feel dizzy or faint after using it. Try to remain calm. This will  help you to feel better faster. If you feel dizzy, take several deep breaths and lie down with your feet propped up, or bend forward with your head resting between your knees. You may get drowsy or dizzy. Do not drive, use machinery, or do anything that needs mental alertness until you know how this drug affects you. Do not stand or sit up quickly, especially if you are an older patient. This reduces the risk of dizzy or fainting spells. Alcohol can make you more drowsy and dizzy. Avoid alcoholic drinks. Do not treat yourself for coughs, colds, or pain while you are taking this medicine without asking your doctor or health care professional for advice. Some ingredients may increase your blood pressure. What side effects may I notice from receiving this medicine? Side effects that you should report to your doctor or health care professional as soon as possible:  blurred vision  dry mouth  skin rash  sweating  the feeling of extreme pressure in the head  unusually weak or tired Side effects that usually do not require medical attention (report to your doctor or health care professional if they continue or are bothersome):  flushing of the face or neck  headache  irregular heartbeat, palpitations  nausea, vomiting This list may not describe all possible side effects. Call your doctor for medical advice about side effects. You may report side effects to FDA at 1-800-FDA-1088. Where should I keep my medicine? Keep out of the reach of children. Store at room temperature between 20 and 25 degrees C (68 and 77 degrees F). Store in Retail buyeroriginal container. Protect from light and moisture. Keep tightly closed. Throw away any unused medicine after the expiration date. NOTE: This sheet is a summary. It may not cover all possible information. If you have questions about this medicine, talk to your doctor, pharmacist, or health care provider.  2020 Elsevier/Gold Standard (2013-04-10 17:57:36)

## 2019-06-23 ENCOUNTER — Telehealth (HOSPITAL_COMMUNITY): Payer: Self-pay | Admitting: Radiology

## 2019-06-23 NOTE — Telephone Encounter (Signed)
Patient given detailed instructions per Myocardial Perfusion Study Information Sheet for the test on 06/25/2019 at 10:15. Patient notified to arrive 15 minutes early and that it is imperative to arrive on time for appointment to keep from having the test rescheduled.  If you need to cancel or reschedule your appointment, please call the office within 24 hours of your appointment. . Patient verbalized understanding.EHK

## 2019-06-25 ENCOUNTER — Other Ambulatory Visit: Payer: Self-pay

## 2019-06-25 ENCOUNTER — Ambulatory Visit (HOSPITAL_BASED_OUTPATIENT_CLINIC_OR_DEPARTMENT_OTHER): Payer: Medicare Other

## 2019-06-25 VITALS — Ht 67.0 in | Wt 175.0 lb

## 2019-06-25 DIAGNOSIS — I495 Sick sinus syndrome: Secondary | ICD-10-CM | POA: Diagnosis present

## 2019-06-25 DIAGNOSIS — I209 Angina pectoris, unspecified: Secondary | ICD-10-CM

## 2019-06-25 DIAGNOSIS — R110 Nausea: Secondary | ICD-10-CM | POA: Diagnosis present

## 2019-06-25 DIAGNOSIS — R0989 Other specified symptoms and signs involving the circulatory and respiratory systems: Secondary | ICD-10-CM

## 2019-06-25 DIAGNOSIS — R079 Chest pain, unspecified: Secondary | ICD-10-CM | POA: Diagnosis present

## 2019-06-25 DIAGNOSIS — R011 Cardiac murmur, unspecified: Secondary | ICD-10-CM | POA: Diagnosis present

## 2019-06-25 LAB — MYOCARDIAL PERFUSION IMAGING
LV dias vol: 83 mL (ref 46–106)
LV sys vol: 35 mL
Peak HR: 55 {beats}/min
Rest HR: 33 {beats}/min
SDS: 0
SRS: 0
SSS: 0
TID: 1.06

## 2019-06-25 MED ORDER — AMINOPHYLLINE 25 MG/ML IV SOLN
150.0000 mg | Freq: Once | INTRAVENOUS | Status: AC
  Administered 2019-06-25: 150 mg via INTRAVENOUS

## 2019-06-25 MED ORDER — TECHNETIUM TC 99M TETROFOSMIN IV KIT
10.2000 | PACK | Freq: Once | INTRAVENOUS | Status: AC | PRN
  Administered 2019-06-25: 10.2 via INTRAVENOUS
  Filled 2019-06-25: qty 11

## 2019-06-25 MED ORDER — REGADENOSON 0.4 MG/5ML IV SOLN
0.4000 mg | Freq: Once | INTRAVENOUS | Status: AC
  Administered 2019-06-25: 0.4 mg via INTRAVENOUS

## 2019-06-25 MED ORDER — TECHNETIUM TC 99M TETROFOSMIN IV KIT
32.4000 | PACK | Freq: Once | INTRAVENOUS | Status: AC | PRN
  Administered 2019-06-25: 32.4 via INTRAVENOUS
  Filled 2019-06-25: qty 33

## 2019-06-26 ENCOUNTER — Ambulatory Visit (HOSPITAL_BASED_OUTPATIENT_CLINIC_OR_DEPARTMENT_OTHER)
Admission: RE | Admit: 2019-06-26 | Discharge: 2019-06-26 | Disposition: A | Discharge status: Home / Self Care | Payer: Medicare Other | Source: Ambulatory Visit | Attending: Cardiology | Admitting: Cardiology

## 2019-06-26 ENCOUNTER — Encounter (HOSPITAL_BASED_OUTPATIENT_CLINIC_OR_DEPARTMENT_OTHER): Payer: Self-pay

## 2019-06-26 ENCOUNTER — Ambulatory Visit (HOSPITAL_BASED_OUTPATIENT_CLINIC_OR_DEPARTMENT_OTHER): Admission: RE | Admit: 2019-06-26 | Payer: Medicare Other | Source: Ambulatory Visit

## 2019-06-26 DIAGNOSIS — I495 Sick sinus syndrome: Secondary | ICD-10-CM | POA: Insufficient documentation

## 2019-06-26 DIAGNOSIS — R079 Chest pain, unspecified: Secondary | ICD-10-CM | POA: Insufficient documentation

## 2019-06-26 DIAGNOSIS — I209 Angina pectoris, unspecified: Secondary | ICD-10-CM

## 2019-06-26 DIAGNOSIS — R0989 Other specified symptoms and signs involving the circulatory and respiratory systems: Secondary | ICD-10-CM | POA: Insufficient documentation

## 2019-06-26 DIAGNOSIS — R110 Nausea: Secondary | ICD-10-CM | POA: Insufficient documentation

## 2019-06-26 DIAGNOSIS — R011 Cardiac murmur, unspecified: Secondary | ICD-10-CM | POA: Diagnosis not present

## 2019-06-26 NOTE — Progress Notes (Signed)
  Echocardiogram 2D Echocardiogram has been performed.  Cardell Peach 06/26/2019, 9:06 AM

## 2019-06-30 ENCOUNTER — Telehealth: Payer: Self-pay

## 2019-06-30 ENCOUNTER — Telehealth: Payer: Self-pay | Admitting: Cardiology

## 2019-06-30 NOTE — Telephone Encounter (Signed)
Left message for patient to call back for results.  

## 2019-06-30 NOTE — Telephone Encounter (Signed)
-----   Message from Rajan R Revankar, MD sent at 06/29/2019  2:15 PM EST ----- The results of the study is unremarkable. Please inform patient. I will discuss in detail at next appointment. Cc  primary care/referring physician Rajan R Revankar, MD 06/29/2019 2:15 PM  

## 2019-06-30 NOTE — Telephone Encounter (Signed)
Patient is asking for results of testing

## 2019-07-01 NOTE — Telephone Encounter (Signed)
Called x2

## 2019-07-01 NOTE — Telephone Encounter (Signed)
Results relayed, copy sent to Dr. Ruehle. 

## 2019-07-10 NOTE — Addendum Note (Signed)
Addended by: Pamala Hurry on: 07/10/2019 12:31 PM   Modules accepted: Orders

## 2019-07-18 ENCOUNTER — Encounter: Payer: Self-pay | Admitting: Cardiology

## 2019-07-18 ENCOUNTER — Ambulatory Visit (INDEPENDENT_AMBULATORY_CARE_PROVIDER_SITE_OTHER): Payer: Medicare PPO | Admitting: Cardiology

## 2019-07-18 ENCOUNTER — Other Ambulatory Visit: Payer: Self-pay

## 2019-07-18 VITALS — BP 132/70 | HR 50 | Ht 67.0 in | Wt 175.0 lb

## 2019-07-18 DIAGNOSIS — I10 Essential (primary) hypertension: Secondary | ICD-10-CM

## 2019-07-18 DIAGNOSIS — I209 Angina pectoris, unspecified: Secondary | ICD-10-CM

## 2019-07-18 DIAGNOSIS — Z1329 Encounter for screening for other suspected endocrine disorder: Secondary | ICD-10-CM | POA: Diagnosis not present

## 2019-07-18 DIAGNOSIS — R0789 Other chest pain: Secondary | ICD-10-CM | POA: Diagnosis not present

## 2019-07-18 HISTORY — DX: Essential (primary) hypertension: I10

## 2019-07-18 NOTE — Progress Notes (Signed)
Cardiology Office Note:    Date:  07/18/2019   ID:  Shannon Hanson, DOB 08-30-1945, MRN [id number]  PCP:  Charleston Poot, MD  Cardiologist:  Jenean Lindau, MD   Referring MD: Charleston Poot, MD    ASSESSMENT:    No diagnosis found. PLAN:    In order of problems listed above:  1. I discussed my findings with the patient stands of length and primary prevention stressed.  Importance of compliance with diet and medication stressed and he vocalized understanding.  Her blood pressure is stable and I am very happy about it.  Patient is pleased and watching diet well especially salt intake issues and lifestyle modification. 2. She is fasting today and will have complete blood work including lipids 3. Patient will be seen in follow-up appointment in 3 months or earlier if the patient has any concerns    Medication Adjustments/Labs and Tests Ordered: Current medicines are reviewed at length with the patient today.  Concerns regarding medicines are outlined above.  No orders of the defined types were placed in this encounter.  No orders of the defined types were placed in this encounter.    Chief Complaint  Patient presents with  . Follow-up    1 MO FU      History of Present Illness:    Shannon Hanson is a 74 y.o. female.  Patient has past medical history of essential hypertension.  Patient was evaluated by me for chest discomfort and subsequently has done fine.  No orthopnea or PND.  She is walking some on a regular basis.  At the time of my evaluation, the patient is alert awake oriented and in no distress.  Past Medical History:  Diagnosis Date  . Hypertension     Past Surgical History:  Procedure Laterality Date  . ABDOMINAL HYSTERECTOMY      Current Medications: Current Meds  Medication Sig  . acetaminophen (TYLENOL) 500 MG tablet Take 500 mg by mouth every 6 (six) weeks.  Marland Kitchen amLODipine (NORVASC) 10 MG tablet Take 1 tablet (10 mg total) by mouth daily.  . Aspirin  (ASPIR-81 PO) Take by mouth.  . Calcium Carbonate-Vitamin D (CALCIUM 500 + D) 500-125 MG-UNIT TABS Take by mouth.  . cloNIDine (CATAPRES) 0.2 MG tablet Take 0.2 mg by mouth 2 (two) times daily.  Marland Kitchen FIBER COMPLETE PO Take by mouth.  . fluticasone (FLONASE) 50 MCG/ACT nasal spray SMARTSIG:1 Spray(s) Both Nares 1 to 2 Times Daily PRN  . HYDROCHLOROTHIAZIDE PO Take by mouth.  . losartan (COZAAR) 100 MG tablet Take 100 mg by mouth at bedtime.  . nitroGLYCERIN (NITROSTAT) 0.4 MG SL tablet Place 1 tablet (0.4 mg total) under the tongue every 5 (five) minutes as needed.  . Potassium (POTASSIMIN PO) Take by mouth.  . timolol (TIMOPTIC) 0.5 % ophthalmic solution 1 drop 2 (two) times daily.  Marland Kitchen triamterene-hydrochlorothiazide (MAXZIDE-25) 37.5-25 MG tablet Take 1 tablet by mouth daily.     Allergies:   Pregabalin, Shellfish allergy, and Gabapentin   Social History   Socioeconomic History  . Marital status: Married    Spouse name: Not on file  . Number of children: Not on file  . Years of education: Not on file  . Highest education level: Not on file  Occupational History  . Not on file  Tobacco Use  . Smoking status: Never Smoker  . Smokeless tobacco: Never Used  Substance and Sexual Activity  . Alcohol use: No  . Drug use: Never  . Sexual  activity: Never  Other Topics Concern  . Not on file  Social History Narrative  . Not on file   Social Determinants of Health   Financial Resource Strain:   . Difficulty of Paying Living Expenses: Not on file  Food Insecurity:   . Worried About Programme researcher, broadcasting/film/video in the Last Year: Not on file  . Ran Out of Food in the Last Year: Not on file  Transportation Needs:   . Lack of Transportation (Medical): Not on file  . Lack of Transportation (Non-Medical): Not on file  Physical Activity:   . Days of Exercise per Week: Not on file  . Minutes of Exercise per Session: Not on file  Stress:   . Feeling of Stress : Not on file  Social Connections:    . Frequency of Communication with Friends and Family: Not on file  . Frequency of Social Gatherings with Friends and Family: Not on file  . Attends Religious Services: Not on file  . Active Member of Clubs or Organizations: Not on file  . Attends Banker Meetings: Not on file  . Marital Status: Not on file     Family History: The patient's family history includes CAD in her father; Heart disease in her brother; Throat cancer in her brother and mother.  ROS:   Please see the history of present illness.    All other systems reviewed and are negative.  EKGs/Labs/Other Studies Reviewed:    The following studies were reviewed today: IMPRESSIONS    1. Left ventricular ejection fraction, by visual estimation, is 60 to 65%. There is no left ventricular hypertrophy.  2. Global right ventricle has normal systolic function.The right ventricular size is normal. No increase in right ventricular wall thickness.  3. Left atrial size was normal.  4. The mitral valve is normal in structure. Trivial mitral valve regurgitation. No evidence of mitral stenosis.  5. The tricuspid valve is normal in structure.  6. The aortic valve is normal in structure. Aortic valve regurgitation is not visualized. No evidence of aortic valve sclerosis or stenosis.  7. The pulmonic valve was normal in structure. Pulmonic valve regurgitation is not visualized.  Study Highlights    The left ventricular ejection fraction is normal (55-65%).  Nuclear stress EF: 58%.  No T wave inversion was noted during stress.  There was no ST segment deviation noted during stress.  This is a low risk study.   Normal perfusion. LVEF 58% with normal wall motion. This is a low risk study.       Recent Labs: No results found for requested labs within last 8760 hours.  Recent Lipid Panel No results found for: CHOL, TRIG, HDL, CHOLHDL, VLDL, LDLCALC, LDLDIRECT  Physical Exam:    VS:  BP 132/70   Pulse  (!) 50   Ht 5\' 7"  (1.702 m)   Wt 175 lb (79.4 kg)   SpO2 99%   BMI 27.41 kg/m     Wt Readings from Last 3 Encounters:  07/18/19 175 lb (79.4 kg)  06/25/19 175 lb (79.4 kg)  06/19/19 175 lb (79.4 kg)     GEN: Patient is in no acute distress HEENT: Normal NECK: No JVD; No carotid bruits LYMPHATICS: No lymphadenopathy CARDIAC: Hear sounds regular, 2/6 systolic murmur at the apex. RESPIRATORY:  Clear to auscultation without rales, wheezing or rhonchi  ABDOMEN: Soft, non-tender, non-distended MUSCULOSKELETAL:  No edema; No deformity  SKIN: Warm and dry NEUROLOGIC:  Alert and oriented x 3  PSYCHIATRIC:  Normal affect   Signed, Garwin Brothers, MD  07/18/2019 9:56 AM    [facility]

## 2019-07-18 NOTE — Patient Instructions (Signed)
Medication Instructions:  Your physician recommends that you continue on your current medications as directed. Please refer to the Current Medication list given to you today.  *If you need a refill on your cardiac medications before your next appointment, please call your pharmacy*  Lab Work: Your physician recommends that you have a BMP,CBC, TSH, hepatic and lipid drawn  If you have labs (blood work) drawn today and your tests are completely normal, you will receive your results only by: Marland Kitchen MyChart Message (if you have MyChart) OR . A paper copy in the mail If you have any lab test that is abnormal or we need to change your treatment, we will call you to review the results.  Testing/Procedures: NONE  Follow-Up: At [facility], you and your health needs are our priority.  As part of our continuing mission to provide you with exceptional heart care, we have created designated Provider Care Teams.  These Care Teams include your primary Cardiologist (physician) and Advanced Practice Providers (APPs -  Physician Assistants and Nurse Practitioners) who all work together to provide you with the care you need, when you need it.  Your next appointment:   3 month(s)  The format for your next appointment:   In Person  Provider:   Belva Crome, MD

## 2019-07-19 LAB — LIPID PANEL
Chol/HDL Ratio: 3.1 ratio (ref 0.0–4.4)
Cholesterol, Total: 154 mg/dL (ref 100–199)
HDL: 50 mg/dL (ref >39)
LDL Chol Calc (NIH): 90 mg/dL (ref 0–99)
Triglycerides: 69 mg/dL (ref 0–149)
VLDL Cholesterol Cal: 14 mg/dL (ref 5–40)

## 2019-07-19 LAB — HEPATIC FUNCTION PANEL
ALT: 6 IU/L (ref 0–32)
AST: 20 IU/L (ref 0–40)
Albumin: 4.3 g/dL (ref 3.7–4.7)
Alkaline Phosphatase: 78 IU/L (ref 39–117)
Bilirubin Total: 0.5 mg/dL (ref 0.0–1.2)
Bilirubin, Direct: 0.13 mg/dL (ref 0.00–0.40)
Total Protein: 6.7 g/dL (ref 6.0–8.5)

## 2019-07-19 LAB — CBC
Hematocrit: 37.3 % (ref 34.0–46.6)
Hemoglobin: 12.5 g/dL (ref 11.1–15.9)
MCH: 29.4 pg (ref 26.6–33.0)
MCHC: 33.5 g/dL (ref 31.5–35.7)
MCV: 88 fL (ref 79–97)
Platelets: 243 10*3/uL (ref 150–450)
RBC: 4.25 x10E6/uL (ref 3.77–5.28)
RDW: 14.4 % (ref 11.7–15.4)
WBC: 5.9 10*3/uL (ref 3.4–10.8)

## 2019-07-19 LAB — BASIC METABOLIC PANEL
BUN/Creatinine Ratio: 20 (ref 12–28)
BUN: 19 mg/dL (ref 8–27)
CO2: 23 mmol/L (ref 20–29)
Calcium: 9.5 mg/dL (ref 8.7–10.3)
Chloride: 105 mmol/L (ref 96–106)
Creatinine, Ser: 0.93 mg/dL (ref 0.57–1.00)
GFR calc Af Amer: 71 mL/min/{1.73_m2} (ref >59)
GFR calc non Af Amer: 61 mL/min/{1.73_m2} (ref >59)
Glucose: 93 mg/dL (ref 65–99)
Potassium: 4.3 mmol/L (ref 3.5–5.2)
Sodium: 142 mmol/L (ref 134–144)

## 2019-07-19 LAB — TSH: TSH: 3.25 u[IU]/mL (ref 0.450–4.500)

## 2019-07-24 ENCOUNTER — Telehealth: Payer: Self-pay

## 2019-07-24 NOTE — Telephone Encounter (Signed)
-----   Message from Garwin Brothers, MD sent at 07/21/2019 11:53 AM EST ----- The results of the study is unremarkable. Please inform patient. I will discuss in detail at next appointment. Cc  primary care/referring physician Garwin Brothers, MD 07/21/2019 11:52 AM

## 2019-07-24 NOTE — Telephone Encounter (Signed)
Results relayed, copy sent to Dr. Levora Angel.

## 2019-08-07 ENCOUNTER — Telehealth: Payer: Self-pay | Admitting: Cardiology

## 2019-08-07 MED ORDER — AMLODIPINE BESYLATE 5 MG PO TABS: 5.0000 mg | ORAL_TABLET | Freq: Every day | ORAL | 3 refills | Status: DC

## 2019-08-07 MED ORDER — AMLODIPINE BESYLATE 10 MG PO TABS: 10.0000 mg | ORAL_TABLET | Freq: Every day | ORAL | 3 refills | Status: DC

## 2019-08-07 NOTE — Telephone Encounter (Signed)
New message   Patient needs a 5 mg new prescription of amLODipine (NORVASC) 10 MG tablet sent to [id number] - [place], [place] - [address] AT [facility] [address]

## 2019-08-07 NOTE — Telephone Encounter (Signed)
acknowledged

## 2019-08-07 NOTE — Telephone Encounter (Signed)
Follow Up:  Pt wanted you to know she picked up the 5 mg of Amlodpine

## 2019-08-07 NOTE — Addendum Note (Signed)
Addended by: Harlow Asa on: 08/07/2019 02:57 PM   Modules accepted: Orders

## 2019-08-07 NOTE — Telephone Encounter (Signed)
lmtcb

## 2019-08-25 DIAGNOSIS — R079 Chest pain, unspecified: Secondary | ICD-10-CM

## 2019-08-25 HISTORY — DX: Chest pain, unspecified: R07.9

## 2019-09-16 ENCOUNTER — Emergency Department (HOSPITAL_BASED_OUTPATIENT_CLINIC_OR_DEPARTMENT_OTHER): Payer: Medicare PPO

## 2019-09-16 ENCOUNTER — Encounter (HOSPITAL_BASED_OUTPATIENT_CLINIC_OR_DEPARTMENT_OTHER): Payer: Self-pay | Admitting: Emergency Medicine

## 2019-09-16 ENCOUNTER — Other Ambulatory Visit: Payer: Self-pay

## 2019-09-16 ENCOUNTER — Observation Stay (HOSPITAL_BASED_OUTPATIENT_CLINIC_OR_DEPARTMENT_OTHER)
Admission: EM | Admit: 2019-09-16 | Discharge: 2019-09-18 | Disposition: A | Discharge status: Home / Self Care | Payer: Medicare PPO | Attending: Cardiology | Admitting: Cardiology

## 2019-09-16 DIAGNOSIS — I2510 Atherosclerotic heart disease of native coronary artery without angina pectoris: Secondary | ICD-10-CM | POA: Insufficient documentation

## 2019-09-16 DIAGNOSIS — E785 Hyperlipidemia, unspecified: Secondary | ICD-10-CM | POA: Diagnosis not present

## 2019-09-16 DIAGNOSIS — R001 Bradycardia, unspecified: Secondary | ICD-10-CM | POA: Insufficient documentation

## 2019-09-16 DIAGNOSIS — Z20822 Contact with and (suspected) exposure to covid-19: Secondary | ICD-10-CM | POA: Diagnosis not present

## 2019-09-16 DIAGNOSIS — I10 Essential (primary) hypertension: Secondary | ICD-10-CM | POA: Insufficient documentation

## 2019-09-16 DIAGNOSIS — E876 Hypokalemia: Secondary | ICD-10-CM | POA: Insufficient documentation

## 2019-09-16 DIAGNOSIS — R079 Chest pain, unspecified: Secondary | ICD-10-CM

## 2019-09-16 DIAGNOSIS — R748 Abnormal levels of other serum enzymes: Secondary | ICD-10-CM | POA: Diagnosis not present

## 2019-09-16 DIAGNOSIS — I214 Non-ST elevation (NSTEMI) myocardial infarction: Secondary | ICD-10-CM | POA: Insufficient documentation

## 2019-09-16 DIAGNOSIS — R778 Other specified abnormalities of plasma proteins: Secondary | ICD-10-CM

## 2019-09-16 DIAGNOSIS — Z79899 Other long term (current) drug therapy: Secondary | ICD-10-CM | POA: Diagnosis not present

## 2019-09-16 DIAGNOSIS — E7800 Pure hypercholesterolemia, unspecified: Secondary | ICD-10-CM | POA: Diagnosis not present

## 2019-09-16 HISTORY — DX: Chest pain, unspecified: R07.9

## 2019-09-16 LAB — CBC WITH DIFFERENTIAL/PLATELET
Abs Immature Granulocytes: 0.02 10*3/uL (ref 0.00–0.07)
Basophils Absolute: 0 10*3/uL (ref 0.0–0.1)
Basophils Relative: 0 %
Eosinophils Absolute: 0.2 10*3/uL (ref 0.0–0.5)
Eosinophils Relative: 3 %
HCT: 37.6 % (ref 36.0–46.0)
Hemoglobin: 13.1 g/dL (ref 12.0–15.0)
Immature Granulocytes: 0 %
Lymphocytes Relative: 50 %
Lymphs Abs: 3.4 10*3/uL (ref 0.7–4.0)
MCH: 29.4 pg (ref 26.0–34.0)
MCHC: 34.8 g/dL (ref 30.0–36.0)
MCV: 84.5 fL (ref 80.0–100.0)
Monocytes Absolute: 0.7 10*3/uL (ref 0.1–1.0)
Monocytes Relative: 10 %
Neutro Abs: 2.6 10*3/uL (ref 1.7–7.7)
Neutrophils Relative %: 37 %
Platelets: 266 10*3/uL (ref 150–400)
RBC: 4.45 MIL/uL (ref 3.87–5.11)
RDW: 13.8 % (ref 11.5–15.5)
WBC: 6.9 10*3/uL (ref 4.0–10.5)
nRBC: 0 % (ref 0.0–0.2)

## 2019-09-16 LAB — BASIC METABOLIC PANEL
Anion gap: 9 (ref 5–15)
BUN: 23 mg/dL (ref 8–23)
CO2: 27 mmol/L (ref 22–32)
Calcium: 9.4 mg/dL (ref 8.9–10.3)
Chloride: 104 mmol/L (ref 98–111)
Creatinine, Ser: 0.89 mg/dL (ref 0.44–1.00)
GFR calc Af Amer: >60 mL/min (ref >60)
GFR calc non Af Amer: >60 mL/min (ref >60)
Glucose, Bld: 125 mg/dL — ABNORMAL HIGH (ref 70–99)
Potassium: 3.7 mmol/L (ref 3.5–5.1)
Sodium: 140 mmol/L (ref 135–145)

## 2019-09-16 LAB — TROPONIN I (HIGH SENSITIVITY)
Troponin I (High Sensitivity): 20 ng/L — ABNORMAL HIGH (ref <18)
Troponin I (High Sensitivity): 23 ng/L — ABNORMAL HIGH (ref <18)
Troponin I (High Sensitivity): 19 ng/L — ABNORMAL HIGH (ref <18)
Troponin I (High Sensitivity): 32 ng/L — ABNORMAL HIGH (ref <18)
Troponin I (High Sensitivity): 46 ng/L — ABNORMAL HIGH (ref <18)

## 2019-09-16 LAB — HEPARIN LEVEL (UNFRACTIONATED): Heparin Unfractionated: 0.93 IU/mL — ABNORMAL HIGH (ref 0.30–0.70)

## 2019-09-16 LAB — RESPIRATORY PANEL BY RT PCR (FLU A&B, COVID)
Influenza A by PCR: NEGATIVE
Influenza B by PCR: NEGATIVE
SARS Coronavirus 2 by RT PCR: NEGATIVE

## 2019-09-16 LAB — HEMOGLOBIN A1C
Hgb A1c MFr Bld: 4.9 % (ref 4.8–5.6)
Mean Plasma Glucose: 93.93 mg/dL

## 2019-09-16 MED ORDER — ASPIRIN 81 MG PO CHEW
324.0000 mg | CHEWABLE_TABLET | Freq: Once | ORAL | Status: AC
  Administered 2019-09-16: 324 mg via ORAL
  Filled 2019-09-16: qty 4

## 2019-09-16 MED ORDER — ALPRAZOLAM 0.25 MG PO TABS: 0.2500 mg | ORAL_TABLET | Freq: Two times a day (BID) | ORAL | Status: DC | PRN

## 2019-09-16 MED ORDER — SODIUM CHLORIDE 0.9 % IV SOLN: 250.0000 mL | INTRAVENOUS | Status: DC | PRN

## 2019-09-16 MED ORDER — HEPARIN BOLUS VIA INFUSION
4000.0000 [IU] | Freq: Once | INTRAVENOUS | Status: AC
  Administered 2019-09-16: 4000 [IU] via INTRAVENOUS

## 2019-09-16 MED ORDER — LOSARTAN POTASSIUM 50 MG PO TABS
100.0000 mg | ORAL_TABLET | Freq: Every day | ORAL | Status: DC
  Administered 2019-09-16 – 2019-09-17 (×2): 100 mg via ORAL
  Filled 2019-09-16 (×2): qty 2

## 2019-09-16 MED ORDER — ONDANSETRON HCL 4 MG/2ML IJ SOLN: 4.0000 mg | Freq: Four times a day (QID) | INTRAMUSCULAR | Status: DC | PRN

## 2019-09-16 MED ORDER — ASPIRIN EC 81 MG PO TBEC
81.0000 mg | DELAYED_RELEASE_TABLET | Freq: Every day | ORAL | Status: DC
  Administered 2019-09-17 – 2019-09-18 (×2): 81 mg via ORAL
  Filled 2019-09-16 (×2): qty 1

## 2019-09-16 MED ORDER — ACETAMINOPHEN 325 MG PO TABS: 650.0000 mg | ORAL_TABLET | ORAL | Status: DC | PRN

## 2019-09-16 MED ORDER — TRIAMTERENE-HCTZ 37.5-25 MG PO TABS
1.0000 | ORAL_TABLET | Freq: Every morning | ORAL | Status: DC
  Administered 2019-09-17 – 2019-09-18 (×2): 1 via ORAL
  Filled 2019-09-16 (×2): qty 1

## 2019-09-16 MED ORDER — FIBER COMPLETE PO TABS: 2.0000 | ORAL_TABLET | Freq: Two times a day (BID) | ORAL | Status: DC

## 2019-09-16 MED ORDER — ZOLPIDEM TARTRATE 5 MG PO TABS: 5.0000 mg | ORAL_TABLET | Freq: Every evening | ORAL | Status: DC | PRN

## 2019-09-16 MED ORDER — CALCIUM POLYCARBOPHIL 625 MG PO TABS
625.0000 mg | ORAL_TABLET | Freq: Two times a day (BID) | ORAL | Status: DC
  Administered 2019-09-16 – 2019-09-18 (×4): 625 mg via ORAL
  Filled 2019-09-16 (×5): qty 1

## 2019-09-16 MED ORDER — PANTOPRAZOLE SODIUM 40 MG PO TBEC
40.0000 mg | DELAYED_RELEASE_TABLET | Freq: Every day | ORAL | Status: DC
  Administered 2019-09-16 – 2019-09-18 (×3): 40 mg via ORAL
  Filled 2019-09-16 (×3): qty 1

## 2019-09-16 MED ORDER — CALCIUM CARBONATE-VITAMIN D 500-125 MG-UNIT PO TABS: 2.0000 | ORAL_TABLET | Freq: Every evening | ORAL | Status: DC

## 2019-09-16 MED ORDER — FLUTICASONE PROPIONATE 50 MCG/ACT NA SUSP
1.0000 | Freq: Every day | NASAL | Status: DC | PRN
  Filled 2019-09-16: qty 16

## 2019-09-16 MED ORDER — CALCIUM CARBONATE-VITAMIN D 500-200 MG-UNIT PO TABS
2.0000 | ORAL_TABLET | Freq: Every evening | ORAL | Status: DC
  Administered 2019-09-16 – 2019-09-17 (×2): 2 via ORAL
  Filled 2019-09-16 (×2): qty 2

## 2019-09-16 MED ORDER — NITROGLYCERIN 2 % TD OINT
1.0000 [in_us] | TOPICAL_OINTMENT | Freq: Once | TRANSDERMAL | Status: AC
  Administered 2019-09-16: 1 [in_us] via TOPICAL
  Filled 2019-09-16: qty 1

## 2019-09-16 MED ORDER — HYDRALAZINE HCL 20 MG/ML IJ SOLN: 10.0000 mg | INTRAMUSCULAR | Status: DC | PRN

## 2019-09-16 MED ORDER — NITROGLYCERIN 0.4 MG SL SUBL
0.4000 mg | SUBLINGUAL_TABLET | SUBLINGUAL | Status: DC | PRN
  Administered 2019-09-17: 0.8 mg via SUBLINGUAL

## 2019-09-16 MED ORDER — LORATADINE 10 MG PO TABS: 10.0000 mg | ORAL_TABLET | Freq: Every day | ORAL | Status: DC | PRN

## 2019-09-16 MED ORDER — TIMOLOL MALEATE 0.5 % OP SOLN
1.0000 [drp] | Freq: Two times a day (BID) | OPHTHALMIC | Status: DC
  Administered 2019-09-17 – 2019-09-18 (×3): 1 [drp] via OPHTHALMIC
  Filled 2019-09-16: qty 5

## 2019-09-16 MED ORDER — SODIUM CHLORIDE 0.9% FLUSH
3.0000 mL | Freq: Two times a day (BID) | INTRAVENOUS | Status: DC
  Administered 2019-09-16: 3 mL via INTRAVENOUS

## 2019-09-16 MED ORDER — HEPARIN (PORCINE) 25000 UT/250ML-% IV SOLN
700.0000 [IU]/h | INTRAVENOUS | Status: DC
  Administered 2019-09-16: 1000 [IU]/h via INTRAVENOUS
  Administered 2019-09-17: 850 [IU]/h via INTRAVENOUS
  Filled 2019-09-16 (×2): qty 250

## 2019-09-16 MED ORDER — SODIUM CHLORIDE 0.9% FLUSH: 3.0000 mL | INTRAVENOUS | Status: DC | PRN

## 2019-09-16 MED ORDER — AMLODIPINE BESYLATE 5 MG PO TABS
5.0000 mg | ORAL_TABLET | Freq: Every day | ORAL | Status: DC
  Administered 2019-09-16 – 2019-09-17 (×2): 5 mg via ORAL
  Filled 2019-09-16 (×2): qty 1

## 2019-09-16 NOTE — ED Triage Notes (Signed)
Patient arrived via POV c/o chest pain radiating to left arm with tingling starting approximately 0400. Patient states it occurred after she woke up to use the restroom. Patient took 2 nitro at home with some relief. Patient states pain in now 6/10, with it being heaviness and tingling. Patient is AO x 4, normal gait, with elevated BP.

## 2019-09-16 NOTE — ED Provider Notes (Signed)
[facility] EMERGENCY DEPARTMENT Provider Note   CSN: [id number] Arrival date & time: 09/16/19  0515     History Chief Complaint  Patient presents with  . Chest Pain    Shannon Hanson is a 74 y.o. female.  HPI     This is a 74 year old female with a history of hypertension who presents with chest pain.  Patient reports that she woke up this morning at 4 AM to go to the bathroom.  She had onset of pressure like chest pain and tightness.  She also reports some left arm heaviness and numbness.  She states that the symptoms at lasted for approximately 5 minutes.  She took a nitroglycerin which improved her chest pain but had some persistent arm numbness.  She took a second nitroglycerin.  She is mostly pain-free.  She currently rates her pain at 0 out of 10 although she does continue to feel like she has some heaviness in her left arm.  She did not have any diaphoresis or shortness of breath.  No recent fevers or cough.  Has not noted any leg swelling.  Reports that she sees Dr. Tomie China, cardiology.  In December 2020 she had a stress echo that was low risk and had a normal EF.  No known history of heart disease.  Only risk factors are age and hypertension.  Past Medical History:  Diagnosis Date  . Hypertension     Patient Active Problem List   Diagnosis Date Noted  . Essential hypertension 07/18/2019  . Status post laparoscopic cholecystectomy 01/31/2018  . Lumbar radiculopathy 01/27/2015  . 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 Thoracic or lumbosacral neuritis or radiculitis, unspecified 02/02/2014    Past Surgical History:  Procedure Laterality Date  . ABDOMINAL HYSTERECTOMY       OB History   No obstetric history on file.     Family History  Problem Relation Age of Onset  . Throat cancer Mother   . CAD Father   . Heart disease Brother   . Throat cancer Brother     Social History   Tobacco Use  . Smoking status: Never Smoker  . Smokeless  tobacco: Never Used  Substance Use Topics  . Alcohol use: No  . Drug use: Never    Home Medications Prior to Admission medications   Medication Sig Start Date End Date Taking? Authorizing Provider  cloNIDine (CATAPRES) 0.2 MG tablet TAKE 1 TABLET BY MOUTH TWICE DAILY 08/13/19  Yes [provider]  acetaminophen (TYLENOL) 500 MG tablet Take 500 mg by mouth every 6 (six) weeks.    [provider]  amLODipine (NORVASC) 10 MG tablet Take 10 mg by mouth daily. 08/07/19   [provider]  amLODipine (NORVASC) 5 MG tablet Take 1 tablet (5 mg total) by mouth daily. 08/07/19 08/01/20  Revankar, Aundra Dubin, MD  Aspirin (ASPIR-81 PO) Take by mouth.    [provider]  Calcium Carbonate-Vitamin D (CALCIUM 500 + D) 500-125 MG-UNIT TABS Take by mouth.    [provider]  cloNIDine (CATAPRES) 0.2 MG tablet Take 0.2 mg by mouth 2 (two) times daily. 05/19/19   [provider]  diclofenac (VOLTAREN) 50 MG EC tablet Take 50 mg by mouth 3 (three) times daily. 01/29/19   [provider]  EPINEPHrine 0.3 mg/0.3 mL IJ SOAJ injection Inject 0.3 mg into the muscle as needed.    [provider]  FIBER COMPLETE PO Take by mouth.    [provider]  fluticasone (FLONASE) 50 MCG/ACT nasal spray SMARTSIG:1 Spray(s) Both Nares 1 to 2 Times Daily PRN 05/19/19   [provider]  HYDROCHLOROTHIAZIDE PO Take by mouth.    [provider]  losartan (COZAAR) 100 MG tablet Take 100 mg by mouth at bedtime. 06/02/19   [provider]  metoprolol (LOPRESSOR) 50 MG tablet Take 50 mg by mouth 2 (two) times daily.    [provider]  nitroGLYCERIN (NITROSTAT) 0.4 MG SL tablet Place 1 tablet (0.4 mg total) under the tongue every 5 (five) minutes as needed. 06/19/19 09/17/19  Revankar, Aundra Dubin, MD  ondansetron (ZOFRAN ODT) 8 MG disintegrating tablet Take 1 tablet (8 mg total) by mouth every 8 (eight) hours as needed. Patient not  taking: Reported on 06/19/2019 10/31/17   Zadie Rhine, MD  Potassium (POTASSIMIN PO) Take by mouth.    [provider]  timolol (TIMOPTIC) 0.5 % ophthalmic solution 1 drop 2 (two) times daily. 06/02/19   [provider]  triamterene-hydrochlorothiazide (MAXZIDE-25) 37.5-25 MG tablet Take 1 tablet by mouth daily. 06/07/19   [provider]    Allergies    Pregabalin, Shellfish allergy, and Gabapentin  Review of Systems   Review of Systems  Constitutional: Negative for fever.  Respiratory: Negative for shortness of breath.   Cardiovascular: Positive for chest pain. Negative for leg swelling.  Gastrointestinal: Positive for nausea. Negative for abdominal pain and vomiting.  Genitourinary: Negative for dysuria.  Neurological: Positive for numbness.  All other systems reviewed and are negative.   Physical Exam Updated Vital Signs BP (!) 177/69 (BP Location: Right Arm)   Pulse (!) 48   Temp 98 F (36.7 C) (Oral)   Resp 18   Ht 1.702 m (5\' 7" )   Wt 81.8 kg   SpO2 100%   BMI 28.24 kg/m   Physical Exam Vitals and nursing note reviewed.  Constitutional:      Appearance: She is well-developed. She is not ill-appearing.  HENT:     Head: Normocephalic and atraumatic.  Eyes:     Pupils: Pupils are equal, round, and reactive to light.  Cardiovascular:     Rate and Rhythm: Regular rhythm. Bradycardia present.     Heart sounds: Normal heart sounds.  Pulmonary:     Effort: Pulmonary effort is normal. No respiratory distress.     Breath sounds: No wheezing.  Abdominal:     General: Bowel sounds are normal.     Palpations: Abdomen is soft.  Musculoskeletal:     Cervical back: Neck supple.     Right lower leg: No tenderness. No edema.     Left lower leg: No tenderness. No edema.  Skin:    General: Skin is warm and dry.  Neurological:     Mental Status: She is alert and oriented to person, place, and time.  Psychiatric:        Mood and Affect: Mood  normal.     ED Results / Procedures / Treatments   Labs (all labs ordered are listed, but only abnormal results are displayed) Labs Reviewed  BASIC METABOLIC PANEL - Abnormal; Notable for the following components:      Result Value   Glucose, Bld 125 (*)    All other components within normal limits  TROPONIN I (HIGH SENSITIVITY) - Abnormal; Notable for the following components:   Troponin I (High Sensitivity) 20 (*)    All other components within normal limits  RESPIRATORY PANEL BY RT PCR (FLU A&B, COVID)  CBC WITH DIFFERENTIAL/PLATELET    EKG EKG Interpretation  Date/Time:  Tuesday September 16 2019 05:25:11 EDT Ventricular Rate:  48 PR Interval:    QRS Duration: 105 QT Interval:  476 QTC Calculation: 426 R Axis:   14 Text Interpretation: Sinus bradycardia LVH with secondary repolarization abnormality No prior for comparison Confirmed by Thayer Jew [phone]) on 09/16/2019 5:28:41 AM   Radiology DG Chest Portable 1 View  Result Date: 09/16/2019 CLINICAL DATA:  Chest pain EXAM: PORTABLE CHEST 1 VIEW COMPARISON:  None. FINDINGS: The heart size and mediastinal contours are within normal limits. Aortic knob calcifications. Both lungs are clear. The visualized skeletal structures are unremarkable. IMPRESSION: No active disease. Electronically Signed   By: Prudencio Pair M.D.   On: 09/16/2019 05:53    Procedures Procedures (including critical care time)  CRITICAL CARE Performed by: Merryl Hacker   Total critical care time: 35 minutes  Critical care time was exclusive of separately billable procedures and treating other patients.  Critical care was necessary to treat or prevent imminent or life-threatening deterioration.  Critical care was time spent personally by me on the following activities: development of treatment plan with patient and/or surrogate as well as nursing, discussions with consultants, evaluation of patient's response to treatment, examination of  patient, obtaining history from patient or surrogate, ordering and performing treatments and interventions, ordering and review of laboratory studies, ordering and review of radiographic studies, pulse oximetry and re-evaluation of patient's condition.   Medications Ordered in ED Medications  heparin bolus via infusion 4,000 Units (has no administration in time range)  heparin ADULT infusion 100 units/mL (25000 units/254mL sodium chloride 0.45%) (has no administration in time range)  aspirin chewable tablet 324 mg (324 mg Oral Given 09/16/19 0541)  nitroGLYCERIN (NITROGLYN) 2 % ointment 1 inch (1 inch Topical Given 09/16/19 0541)    ED Course  I have reviewed the triage vital signs and the nursing notes.  Pertinent labs & imaging results that were available during my care of the patient were reviewed by me and considered in my medical decision making (see chart for details).  Clinical Course as of Sep 15 628  Tue Sep 16, 2019  0627 On recheck, patient is completely symptom-free with nitroglycerin in place.  Troponin is 20 which is slightly out of normal range.  Pain occurred just prior to arrival.  Given improvement with nitroglycerin and her stated history, feel this is highly suspicious for ACS.  Heparin was started and we will plan for admission to [facility].  Given that she has a primary cardiologist, will consult cardiology for admission.   [CH]    Clinical Course User Index [CH] Asaph Serena, Barbette Hair, MD   MDM Rules/Calculators/A&P                       Patient presents with chest pain.  Chest pain has mostly resolved but she continues to have left arm heaviness.  She is overall nontoxic.  Vital signs notable for blood pressure of 177/69.  She reports compliance with her blood pressure medications.  Pulse is 48.  EKG without acute ischemic changes.  Her history however is pretty concerning given her description and improvement with nitroglycerin.  Patient was given full dose aspirin.   Nitroglycerin paste was applied.  Chest x-ray, CBC, BMP, troponin ordered.  Initial troponin is 20 which is just out of normal range.  IV heparin was started for concerns for primary ACS and NSTEMI.  Patient  has normal renal function and no reason for her troponin to be otherwise elevated.  Will consult with cardiology for admission.  Final Clinical Impression(s) / ED Diagnoses Final diagnoses:  NSTEMI (non-ST elevated myocardial infarction) [facility])    Rx / DC Orders ED Discharge Orders    None       Shon Baton, MD 09/16/19 [id number]

## 2019-09-16 NOTE — H&P (Signed)
CARDIOLOGY ADMISSION NOTE  Patient ID: Shannon Hanson MRN: [id number] DOB/AGE: January 10, 1946 74 y.o.  Admit date: 09/16/2019 Primary Physician   Forrest Moron, MD Primary Cardiologist   Garwin Brothers, MD Chief Complaint    Chest pain  HPI:   She was seen by Dr. Tomie China for evaluation of chest pain.  She did have an echocardiogram as below with normal EF and no wall motion abnormalities.  She had a negative perfusion study in Dec of last year.   She also has bradycardia and has been taken off of her metoprolol.    Today she woke up and went to the bathroom.  She had pain in her left upper chest.  It is somewhat worse with certain positions but for the most part was a dull 8/10 pain.  Not like previous GI complaints.  She took two NTG and had improvement but was still having some pain when she drove herself to the ED. In the ED trop was minimally elevated with no trend upward.  EKG demonstrated no acute abnormalities.  She was treated with heparin, ASA and NTG paste.   She eventually had resolution after about 45 minutes of her pain and has had a twinge since sitting in the ED.  She otherwise had been doing OK.  She went for a walk recently and had no pain.  The patient denies any new symptoms such as  shortness of breath, PND or orthopnea. There have been no reported palpitation, presyncope or syncope.     Past Medical History:  Diagnosis Date  . Hypertension     Past Surgical History:  Procedure Laterality Date  . ABDOMINAL HYSTERECTOMY      Allergies  Allergen Reactions  . Pregabalin Swelling    Lyrica Woozy, feels drugged up   . Shellfish Allergy Shortness Of Breath and Hives  . Gabapentin Other (See Comments)    Woozy, felt drugged     No current facility-administered medications on file prior to encounter.   Current Outpatient Medications on File Prior to Encounter  Medication Sig Dispense Refill  . cloNIDine (CATAPRES) 0.2 MG tablet TAKE 1 TABLET BY MOUTH TWICE DAILY      . acetaminophen (TYLENOL) 500 MG tablet Take 500 mg by mouth every 6 (six) weeks.    Marland Kitchen amLODipine (NORVASC) 10 MG tablet Take 10 mg by mouth daily.    Marland Kitchen amLODipine (NORVASC) 5 MG tablet Take 1 tablet (5 mg total) by mouth daily. 90 tablet 3  . Aspirin (ASPIR-81 PO) Take by mouth.    . Calcium Carbonate-Vitamin D (CALCIUM 500 + D) 500-125 MG-UNIT TABS Take by mouth.    . cloNIDine (CATAPRES) 0.2 MG tablet Take 0.2 mg by mouth 2 (two) times daily.    . diclofenac (VOLTAREN) 50 MG EC tablet Take 50 mg by mouth 3 (three) times daily.    Marland Kitchen EPINEPHrine 0.3 mg/0.3 mL IJ SOAJ injection Inject 0.3 mg into the muscle as needed.    Marland Kitchen FIBER COMPLETE PO Take by mouth.    . fluticasone (FLONASE) 50 MCG/ACT nasal spray SMARTSIG:1 Spray(s) Both Nares 1 to 2 Times Daily PRN    . HYDROCHLOROTHIAZIDE PO Take by mouth.    . losartan (COZAAR) 100 MG tablet Take 100 mg by mouth at bedtime.    . metoprolol (LOPRESSOR) 50 MG tablet Take 50 mg by mouth 2 (two) times daily.    . nitroGLYCERIN (NITROSTAT) 0.4 MG SL tablet Place 1 tablet (0.4 mg total) under the tongue every  5 (five) minutes as needed. 25 tablet 3  . ondansetron (ZOFRAN ODT) 8 MG disintegrating tablet Take 1 tablet (8 mg total) by mouth every 8 (eight) hours as needed. (Patient not taking: Reported on 06/19/2019) 8 tablet 0  . Potassium (POTASSIMIN PO) Take by mouth.    . timolol (TIMOPTIC) 0.5 % ophthalmic solution 1 drop 2 (two) times daily.    Marland Kitchen triamterene-hydrochlorothiazide (MAXZIDE-25) 37.5-25 MG tablet Take 1 tablet by mouth daily.     Social History   Socioeconomic History  . Marital status: Married    Spouse name: Not on file  . Number of children: Not on file  . Years of education: Not on file  . Highest education level: Not on file  Occupational History  . Not on file  Tobacco Use  . Smoking status: Never Smoker  . Smokeless tobacco: Never Used  Substance and Sexual Activity  . Alcohol use: No  . Drug use: Never  . Sexual  activity: Never  Other Topics Concern  . Not on file  Social History Narrative  . Not on file   Social Determinants of Health   Financial Resource Strain:   . Difficulty of Paying Living Expenses:   Food Insecurity:   . Worried About Charity fundraiser in the Last Year:   . Arboriculturist in the Last Year:   Transportation Needs:   . Film/video editor (Medical):   Marland Kitchen Lack of Transportation (Non-Medical):   Physical Activity:   . Days of Exercise per Week:   . Minutes of Exercise per Session:   Stress:   . Feeling of Stress :   Social Connections:   . Frequency of Communication with Friends and Family:   . Frequency of Social Gatherings with Friends and Family:   . Attends Religious Services:   . Active Member of Clubs or Organizations:   . Attends Archivist Meetings:   Marland Kitchen Marital Status:   Intimate Partner Violence:   . Fear of Current or Ex-Partner:   . Emotionally Abused:   Marland Kitchen Physically Abused:   . Sexually Abused:     Family History  Problem Relation Age of Onset  . Throat cancer Mother   . CAD Father   . Heart disease Brother   . Throat cancer Brother      ROS:  As stated in the HPI and negative for all other systems.  Physical Exam: Blood pressure (!) 162/66, pulse (!) 51, temperature 98 F (36.7 C), temperature source Oral, resp. rate 20, height 5\' 7"  (1.702 m), weight 81.8 kg, SpO2 100 %.  GENERAL:  Well appearing HEENT:  Pupils equal round and reactive, fundi not visualized, oral mucosa unremarkable NECK:  No jugular venous distention, waveform within normal limits, carotid upstroke brisk and symmetric, no bruits, no thyromegaly LYMPHATICS:  No cervical, inguinal adenopathy LUNGS:  Clear to auscultation bilaterally BACK:  No CVA tenderness CHEST:  Unremarkable HEART:  PMI not displaced or sustained,S1 and S2 within normal limits, no S3, no S4, no clicks, no rubs, no murmurs ABD:  Flat, positive bowel sounds normal in frequency in pitch,  no bruits, no rebound, no guarding, no midline pulsatile mass, no hepatomegaly, no splenomegaly EXT:  2 plus pulses throughout, no edema, no cyanosis no clubbing SKIN:  No rashes no nodules NEURO:  Cranial nerves II through XII grossly intact, motor grossly intact throughout PSYCH:  Cognitively intact, oriented to person place and time   Labs: Lab Results  Component Value Date   BUN 23 09/16/2019   Lab Results  Component Value Date   CREATININE 0.89 09/16/2019   Lab Results  Component Value Date   NA 140 09/16/2019   K 3.7 09/16/2019   CL 104 09/16/2019   CO2 27 09/16/2019   No results found for: TROPONINI Lab Results  Component Value Date   WBC 6.9 09/16/2019   HGB 13.1 09/16/2019   HCT 37.6 09/16/2019   MCV 84.5 09/16/2019   PLT 266 09/16/2019   Lab Results  Component Value Date   CHOL 154 07/18/2019   HDL 50 07/18/2019   LDLCALC 90 07/18/2019   TRIG 69 07/18/2019   CHOLHDL 3.1 07/18/2019   Lab Results  Component Value Date   ALT 6 07/18/2019   AST 20 07/18/2019   ALKPHOS 78 07/18/2019   BILITOT 0.5 07/18/2019     ECHO:  1. Left ventricular ejection fraction, by visual estimation, is 60 to  65%. There is no left ventricular hypertrophy.  2. Global right ventricle has normal systolic function.The right  ventricular size is normal. No increase in right ventricular wall  thickness.  3. Left atrial size was normal.  4. The mitral valve is normal in structure. Trivial mitral valve  regurgitation. No evidence of mitral stenosis.  5. The tricuspid valve is normal in structure.  6. The aortic valve is normal in structure. Aortic valve regurgitation is  not visualized. No evidence of aortic valve sclerosis or stenosis.  7. The pulmonic valve was normal in structure. Pulmonic valve  regurgitation is not visualized.   Radiology:  CXR:  NAD  EKG:  SB, rate 48, no acute ST T wave changes.   ASSESSMENT AND PLAN:    CHEST PAIN:    The chest pain has  atypical and typical features.  She certainly has a very significant family history.  The pretest probability of CAD is moderate. I will suggest a coronary CTA for the morning.  Further evaluation will be based on these results.  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  She did have an event monitor.  No change in therapy.   HTN:  BP is controlled at home by her report.  She will continue her previous out patient meds.     SignedRollene Rotunda 09/16/2019, 6:12 PM

## 2019-09-16 NOTE — Progress Notes (Signed)
This nurse spoke with patient's nurse and notified to reconsult VAST when 13 hour prep is almost complete. At that time VAST will start 18g per CT request. Nurse VU. Tomasita Morrow, RN VAST

## 2019-09-16 NOTE — Progress Notes (Signed)
ANTICOAGULATION CONSULT NOTE - Initial Consult  Pharmacy Consult for heparin Indication: chest pain/ACS  Allergies  Allergen Reactions  . Pregabalin Swelling    Lyrica Woozy, feels drugged up   . Shellfish Allergy Shortness Of Breath and Hives  . Gabapentin Other (See Comments)    Woozy, felt drugged      Patient Measurements: Height: 5\' 7"  (170.2 cm) Weight: 180 lb 4.8 oz (81.8 kg) IBW/kg (Calculated) : 61.6 Heparin Dosing Weight: 78kg  Vital Signs: Temp: 98 F (36.7 C) (03/23 0525) Temp Source: Oral (03/23 0525) BP: 159/89 (03/23 1400) Pulse Rate: 53 (03/23 1400)  Labs: Recent Labs    09/16/19 0531 09/16/19 0536 09/16/19 0731 09/16/19 1248  HGB  --  13.1  --   --   HCT  --  37.6  --   --   PLT  --  266  --   --   HEPARINUNFRC  --   --   --  0.93*  CREATININE  --  0.89  --   --   TROPONINIHS 20*  --  23* 19*    Estimated Creatinine Clearance: 61.9 mL/min (by C-G formula based on SCr of 0.89 mg/dL).   Medical History: Past Medical History:  Diagnosis Date  . Hypertension    Assessment: 82 YOF presenting with CP, started on heparin gtt for ACS r/o, initial heparin level supratherapeutic, no bleeding reported.  Goal of Therapy:  Heparin level 0.3-0.7 units/ml Monitor platelets by anticoagulation protocol: Yes   Plan:  Decrease heparin gtt to 850 units/hr F/u 6 hour heparin level  65, PharmD Clinical Pharmacist ED Pharmacist Phone # [phone] 09/16/2019 3:01 PM

## 2019-09-16 NOTE — Progress Notes (Addendum)
ANTICOAGULATION CONSULT NOTE - Follow Up Consult  Pharmacy Consult for heparin Indication: chest pain/ACS  Allergies  Allergen Reactions  . Pregabalin Swelling    Lyrica Woozy, feels drugged up   . Shellfish Allergy Shortness Of Breath and Hives  . Gabapentin Other (See Comments)    Woozy, felt drugged      Patient Measurements: Height: 5\' 7"  (170.2 cm) Weight: 180 lb 4.8 oz (81.8 kg) IBW/kg (Calculated) : 61.6 Heparin Dosing Weight: 78 kg  Vital Signs: Temp: 98 F (36.7 C) (03/23 0525) Temp Source: Oral (03/23 0525) BP: 159/89 (03/23 1400) Pulse Rate: 53 (03/23 1400)  Labs: Recent Labs    09/16/19 0531 09/16/19 0536 09/16/19 0731 09/16/19 1248  HGB  --  13.1  --   --   HCT  --  37.6  --   --   PLT  --  266  --   --   HEPARINUNFRC  --   --   --  0.93*  CREATININE  --  0.89  --   --   TROPONINIHS 20*  --  23* 19*    Estimated Creatinine Clearance: 61.9 mL/min (by C-G formula based on SCr of 0.89 mg/dL).   Medications:  (Not in a hospital admission)  Scheduled:     Goal of Therapy:  Heparin level 0.3-0.7 units/ml Monitor platelets by anticoagulation protocol: Yes   Plan:  Heparin drip already modified secondary to heparin level.  Shannon Hanson A Willadean Guyton 09/16/2019,3:36 PM

## 2019-09-16 NOTE — Progress Notes (Signed)
Per Dr. Okey Dupre and CT, patient does not need pre-medication prep for CT since patient allergy is only to shellfish, not contrast dye.   Adair Patter, RN

## 2019-09-17 ENCOUNTER — Observation Stay (HOSPITAL_COMMUNITY): Payer: Medicare PPO

## 2019-09-17 ENCOUNTER — Encounter (HOSPITAL_COMMUNITY): Payer: Self-pay | Admitting: Cardiology

## 2019-09-17 DIAGNOSIS — R079 Chest pain, unspecified: Secondary | ICD-10-CM

## 2019-09-17 LAB — COMPREHENSIVE METABOLIC PANEL
ALT: 16 U/L (ref 0–44)
AST: 24 U/L (ref 15–41)
Albumin: 3.8 g/dL (ref 3.5–5.0)
Alkaline Phosphatase: 62 U/L (ref 38–126)
Anion gap: 10 (ref 5–15)
BUN: 14 mg/dL (ref 8–23)
CO2: 25 mmol/L (ref 22–32)
Calcium: 9.4 mg/dL (ref 8.9–10.3)
Chloride: 106 mmol/L (ref 98–111)
Creatinine, Ser: 0.79 mg/dL (ref 0.44–1.00)
GFR calc Af Amer: >60 mL/min (ref >60)
GFR calc non Af Amer: >60 mL/min (ref >60)
Glucose, Bld: 101 mg/dL — ABNORMAL HIGH (ref 70–99)
Potassium: 3.4 mmol/L — ABNORMAL LOW (ref 3.5–5.1)
Sodium: 141 mmol/L (ref 135–145)
Total Bilirubin: 0.9 mg/dL (ref 0.3–1.2)
Total Protein: 6.8 g/dL (ref 6.5–8.1)

## 2019-09-17 LAB — CBC
HCT: 36.5 % (ref 36.0–46.0)
Hemoglobin: 13 g/dL (ref 12.0–15.0)
MCH: 29.3 pg (ref 26.0–34.0)
MCHC: 35.6 g/dL (ref 30.0–36.0)
MCV: 82.2 fL (ref 80.0–100.0)
Platelets: 241 10*3/uL (ref 150–400)
RBC: 4.44 MIL/uL (ref 3.87–5.11)
RDW: 13.2 % (ref 11.5–15.5)
WBC: 6.3 10*3/uL (ref 4.0–10.5)
nRBC: 0 % (ref 0.0–0.2)

## 2019-09-17 LAB — LIPID PANEL
Cholesterol: 171 mg/dL (ref 0–200)
HDL: 43 mg/dL (ref >40)
LDL Cholesterol: 111 mg/dL — ABNORMAL HIGH (ref 0–99)
Total CHOL/HDL Ratio: 4 RATIO
Triglycerides: 84 mg/dL (ref <150)
VLDL: 17 mg/dL (ref 0–40)

## 2019-09-17 LAB — HEPARIN LEVEL (UNFRACTIONATED): Heparin Unfractionated: 0.53 IU/mL (ref 0.30–0.70)

## 2019-09-17 MED ORDER — POTASSIUM CHLORIDE CRYS ER 20 MEQ PO TBCR
40.0000 meq | EXTENDED_RELEASE_TABLET | Freq: Once | ORAL | Status: AC
  Administered 2019-09-17: 40 meq via ORAL
  Filled 2019-09-17: qty 2

## 2019-09-17 MED ORDER — IOHEXOL 350 MG/ML SOLN
100.0000 mL | Freq: Once | INTRAVENOUS | Status: AC | PRN
  Administered 2019-09-17: 100 mL via INTRAVENOUS

## 2019-09-17 MED ORDER — NITROGLYCERIN 0.4 MG SL SUBL
SUBLINGUAL_TABLET | SUBLINGUAL | Status: AC
  Filled 2019-09-17: qty 2

## 2019-09-17 MED ORDER — AMLODIPINE BESYLATE 10 MG PO TABS
10.0000 mg | ORAL_TABLET | Freq: Every day | ORAL | Status: DC
  Administered 2019-09-18: 10 mg via ORAL
  Filled 2019-09-17: qty 1

## 2019-09-17 MED ORDER — AMLODIPINE BESYLATE 5 MG PO TABS
5.0000 mg | ORAL_TABLET | Freq: Once | ORAL | Status: AC
  Administered 2019-09-17: 5 mg via ORAL
  Filled 2019-09-17: qty 1

## 2019-09-17 NOTE — Progress Notes (Addendum)
Progress Note  Patient Name: Shannon Hanson Date of Encounter: 09/17/2019  Primary Cardiologist: Garwin Brothers, MD   Subjective   She reports feeling good this morning. No recurrent chest pain. No complaints of SOB or palpitations.   Inpatient Medications    Scheduled Meds: . amLODipine  5 mg Oral Daily  . aspirin EC  81 mg Oral Daily  . calcium-vitamin D  2 tablet Oral QPM  . losartan  100 mg Oral QHS  . pantoprazole  40 mg Oral Daily  . polycarbophil  625 mg Oral BID  . sodium chloride flush  3 mL Intravenous Q12H  . timolol  1 drop Both Eyes BID  . triamterene-hydrochlorothiazide  1 tablet Oral q AM   Continuous Infusions: . sodium chloride    . heparin 850 Units/hr (09/17/19 0615)   PRN Meds: sodium chloride, acetaminophen, ALPRAZolam, fluticasone, hydrALAZINE, loratadine, nitroGLYCERIN, ondansetron (ZOFRAN) IV, sodium chloride flush, zolpidem   Vital Signs    Vitals:   09/17/19 0007 09/17/19 0427 09/17/19 0757 09/17/19 1022  BP: (!) 176/117 (!) 157/81 (!) 171/72 (!) 177/76  Pulse: (!) 57 (!) 53 (!) 55   Resp: 20 18 20    Temp: 98.2 F (36.8 C) 98.3 F (36.8 C) 97.6 F (36.4 C)   TempSrc: Oral Oral Oral   SpO2: 100%  100%   Weight:  80 kg    Height:        Intake/Output Summary (Last 24 hours) at 09/17/2019 1056 Last data filed at 09/17/2019 0400 Gross per 24 hour  Intake 232.79 ml  Output 200 ml  Net 32.79 ml   Filed Weights   09/16/19 0526 09/17/19 0427  Weight: 81.8 kg 80 kg    Telemetry    Sinus bradycardia, rates predominately in the 40s-50s, occasional PVC - Personally Reviewed  ECG    No new tracings - Personally Reviewed  Physical Exam   GEN: No acute distress.   Neck: No JVD, no carotid bruits Cardiac: slow rate, regular rhythm, no murmurs, rubs, or gallops.  Respiratory: Clear to auscultation bilaterally, no wheezes/ rales/ rhonchi GI: NABS, Soft, nontender, non-distended  MS: No edema; No deformity. Neuro:  Nonfocal, moving  all extremities spontaneously Psych: Normal affect   Labs    Chemistry Recent Labs  Lab 09/16/19 0536 09/17/19 0014  NA 140 141  K 3.7 3.4*  CL 104 106  CO2 27 25  GLUCOSE 125* 101*  BUN 23 14  CREATININE 0.89 0.79  CALCIUM 9.4 9.4  PROT  --  6.8  ALBUMIN  --  3.8  AST  --  24  ALT  --  16  ALKPHOS  --  62  BILITOT  --  0.9  GFRNONAA >60 >60  GFRAA >60 >60  ANIONGAP 9 10     Hematology Recent Labs  Lab 09/16/19 0536 09/17/19 0014  WBC 6.9 6.3  RBC 4.45 4.44  HGB 13.1 13.0  HCT 37.6 36.5  MCV 84.5 82.2  MCH 29.4 29.3  MCHC 34.8 35.6  RDW 13.8 13.2  PLT 266 241    Cardiac EnzymesNo results for input(s): TROPONINI in the last 168 hours. No results for input(s): TROPIPOC in the last 168 hours.   BNPNo results for input(s): BNP, PROBNP in the last 168 hours.   DDimer No results for input(s): DDIMER in the last 168 hours.   Radiology    DG Chest Portable 1 View  Result Date: 09/16/2019 CLINICAL DATA:  Chest pain EXAM: PORTABLE CHEST 1  VIEW COMPARISON:  None. FINDINGS: The heart size and mediastinal contours are within normal limits. Aortic knob calcifications. Both lungs are clear. The visualized skeletal structures are unremarkable. IMPRESSION: No active disease. Electronically Signed   By: Prudencio Pair M.D.   On: 09/16/2019 05:53    Cardiac Studies   Echocardiogram 06/26/2019: 1. Left ventricular ejection fraction, by visual estimation, is 60 to  65%. There is no left ventricular hypertrophy.  2. Global right ventricle has normal systolic function.The right  ventricular size is normal. No increase in right ventricular wall  thickness.  3. Left atrial size was normal.  4. The mitral valve is normal in structure. Trivial mitral valve  regurgitation. No evidence of mitral stenosis.  5. The tricuspid valve is normal in structure.  6. The aortic valve is normal in structure. Aortic valve regurgitation is  not visualized. No evidence of aortic valve  sclerosis or stenosis.  7. The pulmonic valve was normal in structure. Pulmonic valve  regurgitation is not visualized.   NST 06/25/2019:  The left ventricular ejection fraction is normal (55-65%).  Nuclear stress EF: 58%.  No T wave inversion was noted during stress.  There was no ST segment deviation noted during stress.  This is a low risk study.   Normal perfusion. LVEF 58% with normal wall motion. This is a low risk study.  Patient Profile     74 y.o. female with PMH of HTN, with recent negative NST to evaluate chest pain, who presented with somewhat atypical chest pain 09/16/19, admitted to cardiology for further evaluation  Assessment & Plan    1. Chest pain in patient without CAD history: she presented with some atypical/typical chest pain. Trops mildly elevated to 46 with flat trend. EKG non-ischemic. Given recent negative stress test decision was made to pursue a coronary CTA.  - Await coronary CTA results - Continue aggressive risk factor modifications below  2. HTN: BP poorly controlled despite multiple agents. Bradycardia limits addition of BBlocker. Looks like she was on clonidine 0.3mg  qHS which is not ideal. This has been held this admission - Favor discontinuing clonidine - Will increase amlodipine to 10mg  daily this morning - Continue losartan  3. Elevated LDL: no formal diagnosis of HLD. Not on cholesterol medications - If coronary CTA reveals any CAD, will recommend starting a statin - Otherwise recommend dietary/lifestyle modifications to promote lower cholesterol - goal <100 for prevention of heart disease  4. Hypokalemia: K 3.4 this morning - Will replete with 40 mEq x1     For questions or updates, please contact [place] Please consult [email or web address] for contact info under Cardiology/STEMI.      Signed, Abigail Butts, PA-C  09/17/2019, 10:56 AM   [phone]  Patient seen and examined with Teodoro Kil PAC.  Agree as above, with the  following exceptions and changes as noted below. CCTA with minimal disease. HTN still not well controlled. Gen: NAD, CV: RRR, no murmurs, Lungs: clear, Abd: soft, Extrem: Warm, well perfused, no edema, Neuro/Psych: alert and oriented x 3, normal mood and affect. All available labs, radiology testing, previous records reviewed. Will uptitrate amlodipine, however she says when she takes 10 mg she gets dizzy, but is willing to try this again. Consider addition of hydralazine as well. Plan for hospital discharge in AM and plan for appt with HTN clinic Dr. Oval Linsey as well as Dr. Geraldo Pitter upon dismissal.  Elouise Munroe 09/17/19 10:29 PM

## 2019-09-17 NOTE — Progress Notes (Signed)
ANTICOAGULATION CONSULT NOTE - Follow Up Consult  Pharmacy Consult for heparin Indication: chest pain/ACS  Assessment:  74 yo lady on heparin for CP.  Heparin level therapeutic   Goal of Therapy:  Heparin level 0.3-0.7 units/ml Monitor platelets by anticoagulation protocol: Yes   Plan:  Continue heparin at 850 units/hr Daily heparin level and CBC Monitor for bleeding compliations  Shannon Hanson 09/17/2019,12:58 AM

## 2019-09-18 DIAGNOSIS — E785 Hyperlipidemia, unspecified: Secondary | ICD-10-CM

## 2019-09-18 DIAGNOSIS — R7989 Other specified abnormal findings of blood chemistry: Secondary | ICD-10-CM

## 2019-09-18 DIAGNOSIS — R778 Other specified abnormalities of plasma proteins: Secondary | ICD-10-CM

## 2019-09-18 DIAGNOSIS — R079 Chest pain, unspecified: Secondary | ICD-10-CM | POA: Diagnosis not present

## 2019-09-18 HISTORY — DX: Hyperlipidemia, unspecified: E78.5

## 2019-09-18 HISTORY — DX: Other specified abnormalities of plasma proteins: R77.8

## 2019-09-18 HISTORY — DX: Other specified abnormal findings of blood chemistry: R79.89

## 2019-09-18 LAB — CBC
HCT: 37.9 % (ref 36.0–46.0)
Hemoglobin: 13.4 g/dL (ref 12.0–15.0)
MCH: 29.2 pg (ref 26.0–34.0)
MCHC: 35.4 g/dL (ref 30.0–36.0)
MCV: 82.6 fL (ref 80.0–100.0)
Platelets: 267 10*3/uL (ref 150–400)
RBC: 4.59 MIL/uL (ref 3.87–5.11)
RDW: 13.3 % (ref 11.5–15.5)
WBC: 6.9 10*3/uL (ref 4.0–10.5)
nRBC: 0 % (ref 0.0–0.2)

## 2019-09-18 LAB — BASIC METABOLIC PANEL
Anion gap: 10 (ref 5–15)
BUN: 16 mg/dL (ref 8–23)
CO2: 25 mmol/L (ref 22–32)
Calcium: 9.5 mg/dL (ref 8.9–10.3)
Chloride: 104 mmol/L (ref 98–111)
Creatinine, Ser: 0.79 mg/dL (ref 0.44–1.00)
GFR calc Af Amer: >60 mL/min (ref >60)
GFR calc non Af Amer: >60 mL/min (ref >60)
Glucose, Bld: 107 mg/dL — ABNORMAL HIGH (ref 70–99)
Potassium: 3.7 mmol/L (ref 3.5–5.1)
Sodium: 139 mmol/L (ref 135–145)

## 2019-09-18 LAB — HEPARIN LEVEL (UNFRACTIONATED): Heparin Unfractionated: 0.8 IU/mL — ABNORMAL HIGH (ref 0.30–0.70)

## 2019-09-18 MED ORDER — AMLODIPINE BESYLATE 10 MG PO TABS: 10.0000 mg | ORAL_TABLET | Freq: Every day | ORAL | 2 refills | Status: DC

## 2019-09-18 MED ORDER — HYDRALAZINE HCL 25 MG PO TABS: 25.0000 mg | ORAL_TABLET | Freq: Every day | ORAL | 0 refills | Status: DC | PRN

## 2019-09-18 NOTE — Progress Notes (Addendum)
ANTICOAGULATION CONSULT NOTE - Follow Up Consult  Pharmacy Consult for heparin Indication: chest pain/ACS  Assessment:  74 yo lady on heparin for CP.  Heparin level is supratherapeutic at 0.8, on 850 units/hr. Hgb 13.4, plt 267. No s/sx of bleeding or infusion issues.  Goal of Therapy:  Heparin level 0.3-0.7 units/ml Monitor platelets by anticoagulation protocol: Yes   Plan:  Discontinue heparin infusion   Sherron Monday, PharmD, BCCCP Clinical Pharmacist  Phone: [phone]  Please check AMION for all [facility] Pharmacy phone numbers After 10:00 PM, call Main Pharmacy [phone] 09/18/2019,9:08 AM

## 2019-09-18 NOTE — Discharge Summary (Addendum)
Discharge Summary    Patient ID: Shannon Hanson MRN: [id number]; DOB: 1946-01-18  Admit date: 09/16/2019 Discharge date: 09/18/2019  Primary Care Provider: Forrest Moron, MD  Primary Cardiologist: Garwin Brothers, MD  Primary Electrophysiologist:  None   Discharge Diagnoses    Principal Problem:   Chest pain of uncertain etiology Active Problems:   Elevated troponin   Essential hypertension   Hyperlipidemia   Diagnostic Studies/Procedures    Coronary CTA 09/18/2019: Impressions: 1. Coronary calcium score of 32. This was 42 percentile for age and sex matched control. 2. Normal coronary origin with right dominance. 3. CAD-RADS 1. Minimal non-obstructive CAD (0-24%) in the ostial RCA portion. Consider non-atherosclerotic causes of chest pain. Consider preventive therapy and risk factor modification. 4. Mildly dilated pulmonary artery measuring 33 mm suggestive of pulmonary hypertension. _____________   History of Present Illness     Shannon Hanson is a 74 y.o. female with a history of hypertension who has been seen by Dr. Tomie China for chest pain. He had a low risk stress test and Echo with LVEF of 60-65% and normal wall motion in 05/2019. She has had some bradycardia and was therefore taken off of Metoprolol.   Patient presented to the ED on 09/16/2019 with chest pain. She states she woke up on morning of presentation and went to the bathroom. Then developed pain in left upper chest. Somewhat worse with certain positions but for the most part was a dull 8/10 pain. Not like previous GI complaints. She took two sublingual nitroglycerin and had improvement but was still having some pain so she drove herself to the ED.   In the ED, high-sensitivity troponin minimally elevated but relatively flat at 20 >> 23 >>19 >> 32 >> 46.  EKG demonstrated no acute abnormalities. She was treated with IV Heparin, Aspirin, and Nitro paste.  She eventually had resolution after about 45 minutes of her  pain and has had a twinge since sitting in the ED. She otherwise had been doing OK.  She went for a walk recently and had no pain.  The patient denies any new symptoms such as  shortness of breath, PND or orthopnea. There have been no reported palpitation, presyncope or syncope.    Hospital Course     Consultants: None.  Chest Pain with Minimally Elevated Troponin Patient admitted for chest pain observation as above. High-sensitivity troponin minimally elevated but flat. Peaked at 46. No EKG changes. Patient underwent coronary CTA on 09/17/2019 which showed coronary calcium score of 32 (59 percentile for age and sex) and minimal non-obstructive CAD with 0-24% in the ostial RCA. Also showed mildly dilated pulmonary artery suggestive of pulmonary hypertension. Not felt to be cardiac chest pain. Minimal troponin elevation possible due to hypertension. Will continue preventative therapy and risk factor modification. Continue Aspirin 81mg  daily. Patient does not want to start a statin at this time (see below).    Hypertension BP was poorly controlled so home medications were adjusted. Patient on Amlodipine 5mg  daily, Losartan 100mg  daily, Triamterene-HCTZ 37.5-25mg  daily, and Clonidine 0.3mg  daily at home. Clonidine was discontinued given baseline bradycardia (rates in the upper 40's to 50's) and Amlodipine was increased to 10mg  daily. Home Losartan and Triamterene-HCTZ were continued. BP this morning better controlled at 135/71. Will also prescribed PRN Hydralazine 25mg  as needed for systolic BP >160/100.  Potassium slightly low at times so will also continue home K-Dur 10 mEq daily at discharge. Patient referred to Hypertension Clinic with Dr. . Recommended continuing to keep a  daily log of BP and heart rates and bring to follow-up appointment.   Hyperlipidemia Lipid panel this admission: Total Cholesterol 171, Triglycerides 84, HDL 43, LDL 111. LDL goal <70 given CAD. Discussed adding statin.  However, patient would like to try lifestyle modifications first. Given minimal disease on CAD, I think this is OK for now. Recommended discussing more with Dr. Tomie Chinaevankar at follow-up.  Patient seen and examined by Dr. Jacques NavyAcharya today and determined to be stable for discharge. Patient already has an appointment with Dr. Tomie Chinaevankar scheduled for 10/02/2019 so can follow-up at that time. Medications as below.  Did the patient have an acute coronary syndrome (MI, NSTEMI, STEMI, etc) this admission?:  No , troponin elevation due to demand ischemia.                               Did the patient have a percutaneous coronary intervention (stent / angioplasty)?:  No.   _____________  Discharge Vitals Blood pressure 135/71, pulse (!) 52, temperature 97.7 F (36.5 C), temperature source Oral, resp. rate 19, height 5\' 7"  (1.702 m), weight 77.8 kg, SpO2 98 %.  Filed Weights   09/16/19 0526 09/17/19 0427 09/18/19 0332  Weight: 81.8 kg 80 kg 77.8 kg   General: 74 y.o. female resting comfortably in no acute distress.  HEENT: Normocephalic and atraumatic.  Neck: Supple. No JVD. Heart: Mildly bradycardic with regular rhythm. Distinct S1 and S2. No murmurs, gallops, or rubs. Radial  pulses 2+ and equal bilaterally. Lungs: No increased work of breathing. Clear to ausculation bilaterally. No wheezes, rhonchi, or rales.  Abdomen: Soft, non-distended, and non-tender to palpation. Bowel sounds present. Extremities: No lower extremity edema.    Skin: Warm and dry. Neuro: Alert and oriented x3. No focal deficits. Psych: Normal affect. Responds appropriately.  Labs & Radiologic Studies    CBC Recent Labs    09/16/19 0536 09/16/19 0536 09/17/19 0014 09/18/19 0324  WBC 6.9   < > 6.3 6.9  NEUTROABS 2.6  --   --   --   HGB 13.1   < > 13.0 13.4  HCT 37.6   < > 36.5 37.9  MCV 84.5   < > 82.2 82.6  PLT 266   < > 241 267   < > = values in this interval not displayed.   Basic Metabolic Panel Recent Labs     09/17/19 0014 09/18/19 0324  NA 141 139  K 3.4* 3.7  CL 106 104  CO2 25 25  GLUCOSE 101* 107*  BUN 14 16  CREATININE 0.79 0.79  CALCIUM 9.4 9.5   Liver Function Tests Recent Labs    09/17/19 0014  AST 24  ALT 16  ALKPHOS 62  BILITOT 0.9  PROT 6.8  ALBUMIN 3.8   No results for input(s): LIPASE, AMYLASE in the last 72 hours. High Sensitivity Troponin:   Recent Labs  Lab 09/16/19 0531 09/16/19 0731 09/16/19 1248 09/16/19 1942 09/16/19 2130  TROPONINIHS 20* 23* 19* 32* 46*    BNP Invalid input(s): POCBNP D-Dimer No results for input(s): DDIMER in the last 72 hours. Hemoglobin A1C Recent Labs    09/16/19 1942  HGBA1C 4.9   Fasting Lipid Panel Recent Labs    09/17/19 0014  CHOL 171  HDL 43  LDLCALC 111*  TRIG 84  CHOLHDL 4.0   Thyroid Function Tests No results for input(s): TSH, T4TOTAL, T3FREE, THYROIDAB in the last 72 hours.  Invalid  input(s): FREET3 _____________  CT CORONARY MORPH W/CTA COR W/SCORE W/CA W/CM &/OR WO/CM  Addendum Date: 09/17/2019   ADDENDUM REPORT: 09/17/2019 16:12 CLINICAL DATA:  74 year old female with h/o hypertension, hyperlipidemia and chest pain. EXAM: Cardiac/Coronary  CTA TECHNIQUE: The patient was scanned on a [employer]. FINDINGS: A 100 kV prospective scan was triggered in the descending thoracic aorta at 111 HU's. Axial non-contrast 3 mm slices were carried out through the heart. The data set was analyzed on a dedicated work station and scored using the Agatson method. Gantry rotation speed was 250 msecs and collimation was .6 mm. No beta blockade and 0.8 mg of sl NTG was given. The 3D data set was reconstructed in 5% intervals of the 67-82 % of the R-R cycle. Diastolic phases were analyzed on a dedicated work station using MPR, MIP and VRT modes. The patient received 80 cc of contrast. Aorta:  Normal size.  No calcifications.  No dissection. Aortic Valve:  Trileaflet.  No calcifications. Coronary Arteries:  Normal  coronary origin.  Right dominance. RCA is a large dominant artery that gives rise to PDA and PLA. There is mild ostial calcified plaque extending into the right coronary sinus and associated with stenosis 0-25%. Proximal, mid and distal RCA have only minimal irregularities. Left main is a large artery that gives rise to LAD, ramus intermedius and LCX arteries. Left main has no plaque. LAD is a large vessel that has  minimal irregularities. Ramus intermedius is medium caliber artery with minimal irregularities. LCX is a non-dominant artery that has no plaque. Other findings: Normal pulmonary vein drainage into the left atrium. Normal left atrial appendage without a thrombus. IMPRESSION: 1. Coronary calcium score of 32. This was 59 percentile for age and sex matched control. 2. Normal coronary origin with right dominance. 3. CAD-RADS 1. Minimal non-obstructive CAD (0-24%) in the ostial RCA portion. Consider non-atherosclerotic causes of chest pain. Consider preventive therapy and risk factor modification. 4. Mildly dilated pulmonary artery measuring 33 mm suggestive of pulmonary hypertension. Electronically Signed   By: Tobias Alexander   On: 09/17/2019 16:12   Result Date: 09/17/2019 EXAM: OVER-READ INTERPRETATION  CT CHEST The following report is an over-read performed by radiologist Dr. Genevive Bi of [facility] on 09/17/2019. This over-read does not include interpretation of cardiac or coronary anatomy or pathology. The coronary CTA interpretation by the cardiologist is attached. COMPARISON:  CT abdomen 10/23/2016. FINDINGS: Limited view of the lung parenchyma demonstrates subpleural nodule in the RIGHT lower lobe measuring 4 mm (image 16/11) which is not changed from comparison CT. Mild subpleural reticulation in the LEFT and RIGHT lower lobe is favored benign. Airways are normal. Limited view of the mediastinum demonstrates no adenopathy. Esophagus normal. Limited view of the upper abdomen  unremarkable. Limited view of the skeleton and chest wall is unremarkable. IMPRESSION: 1. Stable pulmonary nodule RIGHT lower lobe. 2. Subpleural reticulation consistent with mild pulmonary inflammation. Electronically Signed: By: Genevive Bi M.D. On: 09/17/2019 14:38   DG Chest Portable 1 View  Result Date: 09/16/2019 CLINICAL DATA:  Chest pain EXAM: PORTABLE CHEST 1 VIEW COMPARISON:  None. FINDINGS: The heart size and mediastinal contours are within normal limits. Aortic knob calcifications. Both lungs are clear. The visualized skeletal structures are unremarkable. IMPRESSION: No active disease. Electronically Signed   By: Jonna Clark M.D.   On: 09/16/2019 05:53   Disposition   Patient is being discharged home today in good condition.  Follow-up Plans & Appointments  Follow-up Information    [facility] Follow up.   Specialty: Cardiology Why: You have a follow-up appointment with Dr. Tomie China on 10/02/2019 at 10:20am at our [facility] office.  Contact information: [address] [place] [address] [phone]         Discharge Instructions    Diet - low sodium heart healthy   Complete by: As directed    Increase activity slowly   Complete by: As directed       Discharge Medications   Allergies as of 09/18/2019      Reactions   Pregabalin Swelling, Other (See Comments)   Made the patient feel "grugged up and woozy," also   Shellfish Allergy Hives, Shortness Of Breath   Gabapentin Other (See Comments)   Woozy, felt drugged     Other Itching   Ranch dressing      Medication List    STOP taking these medications   cloNIDine 0.2 MG tablet Commonly known as: CATAPRES   ondansetron 8 MG disintegrating tablet Commonly known as: Zofran ODT     TAKE these medications   acetaminophen 500 MG tablet Commonly known as: TYLENOL Take 1,000 mg by mouth every 6 (six) hours as needed for mild pain or headache.     amLODipine 10 MG tablet Commonly known as: NORVASC Take 1 tablet (10 mg total) by mouth daily. Start taking on: September 19, 2019 What changed:   medication strength  how much to take   aspirin EC 81 MG tablet Take 81 mg by mouth daily.   Calcium 500 + D 500-125 MG-UNIT Tabs Generic drug: Calcium Carbonate-Vitamin D Take 2 tablets by mouth every evening.   EPINEPHrine 0.3 mg/0.3 mL Soaj injection Commonly known as: EPI-PEN Inject 0.3 mg into the muscle as needed for anaphylaxis.   FIBER COMPLETE PO Take 2 capsules by mouth in the morning and at bedtime.   fluticasone 50 MCG/ACT nasal spray Commonly known as: FLONASE Place 1 spray into both nostrils daily as needed for allergies or rhinitis.   hydrALAZINE 25 MG tablet Commonly known as: APRESOLINE Take 1 tablet (25 mg total) by mouth daily as needed (for systolic BP (top number) >160 and/or diastolic BP (bottom number) >100).   loratadine 10 MG tablet Commonly known as: CLARITIN Take 10 mg by mouth daily as needed for allergies or rhinitis.   losartan 100 MG tablet Commonly known as: COZAAR Take 100 mg by mouth at bedtime.   NexIUM 24HR Clear Minis 20 MG capsule Generic drug: esomeprazole Take 20 mg by mouth daily as needed (for GERD-like symptoms/indigestion).   nitroGLYCERIN 0.4 MG SL tablet Commonly known as: NITROSTAT Place 1 tablet (0.4 mg total) under the tongue every 5 (five) minutes as needed. What changed: reasons to take this   potassium chloride 10 MEQ tablet Commonly known as: KLOR-CON Take 10 mEq by mouth daily.   timolol 0.5 % ophthalmic solution Commonly known as: TIMOPTIC Place 1 drop into both eyes in the morning and at bedtime.   triamterene-hydrochlorothiazide 37.5-25 MG tablet Commonly known as: MAXZIDE-25 Take 1 tablet by mouth in the morning.          Outstanding Labs/Studies   N/A  Duration of Discharge Encounter   Greater than 30 minutes including physician  time.  Signed, Corrin Parker, PA-C 09/18/2019, 12:46 PM  Patient seen and examined with Marjie Skiff PA-C.  Agree as above, with the following exceptions and changes as noted below.Patient  felling well. Gen: NAD, CV: RRR, no murmurs, Lungs: clear, Abd: soft, Extrem: Warm, well perfused, no edema, Neuro/Psych: alert and oriented x 3, normal mood and affect. All available labs, radiology testing, previous records reviewed. Patient's BP better on amlodipine 10 mg. Will provide prn hydralazine as noted above, and arrange follow up with Dr. Geraldo Pitter and HTN clinic with Skeet Latch.   Elouise Munroe 09/18/19 6:20 PM

## 2019-09-18 NOTE — Discharge Instructions (Signed)
Medication Changes: - STOP Clonidine. - INCREASE Amlodipine to 10mg  daily. - START Hydralazine 25mg  daily if needed for systolic BP (top number) >160 and/or diastolic BP (bottom number) >100.  - Zofran stopped because you said you were no longer taking this.   Continue all other home medications as directed elsewhere on discharge paperwork.  Please continue to keep a daily log of your blood pressure and heart rates to bring to follow-up appointments.

## 2019-09-30 ENCOUNTER — Ambulatory Visit: Payer: Medicare PPO | Admitting: Cardiology

## 2019-10-02 ENCOUNTER — Other Ambulatory Visit: Payer: Self-pay

## 2019-10-02 ENCOUNTER — Ambulatory Visit (INDEPENDENT_AMBULATORY_CARE_PROVIDER_SITE_OTHER): Payer: Medicare PPO | Admitting: Cardiology

## 2019-10-02 ENCOUNTER — Encounter: Payer: Self-pay | Admitting: Cardiology

## 2019-10-02 VITALS — BP 144/80 | HR 54 | Ht 67.0 in | Wt 173.0 lb

## 2019-10-02 DIAGNOSIS — Z79899 Other long term (current) drug therapy: Secondary | ICD-10-CM

## 2019-10-02 DIAGNOSIS — E782 Mixed hyperlipidemia: Secondary | ICD-10-CM

## 2019-10-02 DIAGNOSIS — I2510 Atherosclerotic heart disease of native coronary artery without angina pectoris: Secondary | ICD-10-CM

## 2019-10-02 DIAGNOSIS — I10 Essential (primary) hypertension: Secondary | ICD-10-CM

## 2019-10-02 HISTORY — DX: Atherosclerotic heart disease of native coronary artery without angina pectoris: I25.10

## 2019-10-02 MED ORDER — ATORVASTATIN CALCIUM 10 MG PO TABS: 10.0000 mg | ORAL_TABLET | Freq: Every day | ORAL | 3 refills | Status: DC

## 2019-10-02 NOTE — Patient Instructions (Signed)
Medication Instructions:  Your physician has recommended you make the following change in your medication:   Start atorvastatin 10 mg daily.  *If you need a refill on your cardiac medications before your next appointment, please call your pharmacy*   Lab Work: You had a BMET and LFT's today in the office Your physician recommends that you return for lab work in: 6 weeks You need to have labs done when you are fasting.  You can come Monday through Friday 8:30 am to 12:00 pm and 1:15 to 4:30. You do not need to make an appointment as the order has already been placed. The labs you are going to have done are BMET, LFT and Lipids.  If you have labs (blood work) drawn today and your tests are completely normal, you will receive your results only by: Marland Kitchen MyChart Message (if you have MyChart) OR . A paper copy in the mail If you have any lab test that is abnormal or we need to change your treatment, we will call you to review the results.   Testing/Procedures: None ordered   Follow-Up: At [facility], you and your health needs are our priority.  As part of our continuing mission to provide you with exceptional heart care, we have created designated Provider Care Teams.  These Care Teams include your primary Cardiologist (physician) and Advanced Practice Providers (APPs -  Physician Assistants and Nurse Practitioners) who all work together to provide you with the care you need, when you need it.  We recommend signing up for the patient portal called "MyChart".  Sign up information is provided on this After Visit Summary.  MyChart is used to connect with patients for Virtual Visits (Telemedicine).  Patients are able to view lab/test results, encounter notes, upcoming appointments, etc.  Non-urgent messages can be sent to your provider as well.   To learn more about what you can do with MyChart, go to [email or web address].    Your next appointment:   6 month(s)  The format for your next  appointment:   In Person  Provider:   Belva Crome, MD   Other Instructions Atorvastatin; Ezetimibe oral tablets What is this medicine? ATORVASTATIN; EZETIMIBE (a TORE va sta tin; ez ET i mibe) blocks the body's ability to absorb and make cholesterol. It is used to lower cholesterol. It is only for patients whose cholesterol level is not controlled by diet. This medicine may be used for other purposes; ask your health care provider or pharmacist if you have questions. COMMON BRAND NAME(S): Liptruzet What should I tell my health care provider before I take this medicine? They need to know if you have any of these conditions:  diabetes  history of stroke  if you often drink alcohol  kidney disease  liver disease  muscle aches or weakness  thyroid disease  an unusual or allergic reaction to atorvastatin, ezetimibe, other medicines, foods, dyes, or preservatives  pregnant or trying to get pregnant  breast-feeding How should I use this medicine? Take this medicine by mouth with a glass of water. Follow the directions on the prescription label. Do not cut, crush or chew this medicine. You can take it with or without food. If it upsets your stomach, take it with food. Take your medicine at regular intervals. Do not take it more often than directed. Do not stop taking except on your doctor's advice. Talk to your pediatrician regarding the use of this medicine in children. Special care may be needed. Overdosage: If you think  you have taken too much of this medicine contact a poison control center or emergency room at once. NOTE: This medicine is only for you. Do not share this medicine with others. What if I miss a dose? If you miss a dose, take it as soon as you can. If it is almost time for your next dose, take only that dose. Do not take double or extra doses. What may interact with this medicine? Do not take this medicine with any of the following  medications:  posaconazole  red yeast rice  telithromycin  voriconazole This medicine may also interact with the following medications:  alcohol  antiviral medicines for HIV or AIDS  boceprevir  certain antibiotics like erythromycin and clarithromycin  certain medicines for cholesterol like fenofibrate or gemfibrozil  cimetidine  colchicine  cyclosporine  digoxin  female hormones, like estrogens or progestins and birth control pills  grapefruit juice  medicines for fungal infections like fluconazole, itraconazole, ketoconazole  niacin  rifampin  spironolactone  telaprevir  warfarin This list may not describe all possible interactions. Give your health care provider a list of all the medicines, herbs, non-prescription drugs, or dietary supplements you use. Also tell them if you smoke, drink alcohol, or use illegal drugs. Some items may interact with your medicine. What should I watch for while using this medicine? Visit your doctor or health care professional for regular check-ups. You may need regular tests to make sure your liver is working properly. Tell your doctor or health care professional right away if you get any unexplained muscle pain, tenderness, or weakness, especially if you also have a fever and tiredness. Your doctor or health care professional may tell you to stop taking this medicine if you develop muscle problems. If your muscle problems do not go away after stopping this medicine, contact your health care professional. This medicine may increase blood sugar. Ask your healthcare provider if changes in diet or medicines are needed if you have diabetes. This drug is only part of a total heart-health program. Your doctor or a dietician can suggest a low-cholesterol and low-fat diet to help. Avoid alcohol and smoking, and keep a proper exercise schedule. Do not become pregnant while taking this medicine. Women should inform their doctor if they wish to  become pregnant or think they might be pregnant. There is a potential for serious side effects to an unborn child. Talk to your health care professional or pharmacist for more information. Do not breast-feed an infant while taking this medicine. This medicine may cause a decrease in Co-Enzyme Q-10. You should make sure that you get enough Co-Enzyme Q-10 while you are taking this medicine. Discuss the foods you eat and the vitamins you take with your health care professional. What side effects may I notice from receiving this medicine? Side effects that you should report to your doctor or health care professional as soon as possible:  allergic reactions like skin rash, itching or hives, swelling of the face, lips, or tongue  confusion  dark urine  general ill feeling or flu-like symptoms  light-colored stools  loss of appetite, nausea  loss of memory  muscle pain  redness, blistering, peeling or loosening of the skin, including inside the mouth  right upper belly pain  signs and symptoms of high blood sugar such as being more thirsty or hungry or having to urinate more than normal. You may also feel very tired or have blurry vision.  trouble passing urine  unusually weak  yellowing  of the eyes or skin Side effects that usually do not require medical attention (report to your doctor or health care professional if they continue or are bothersome):  cough  diarrhea  dizziness  joint pain This list may not describe all possible side effects. Call your doctor for medical advice about side effects. You may report side effects to FDA at 1-800-FDA-1088. Where should I keep my medicine? Keep out of the reach of children. Store at room temperature between 15 and 30 degrees C (59 and 86 degrees F). Store in the foil pouch until use. After the foil pouch is opened, protect this medicine from moisture and light. Once a tablet is removed, slide blister card back into case. Store the case  in a dry place, and throw away any unused tablets 30 days after the pouch is opened. NOTE: This sheet is a summary. It may not cover all possible information. If you have questions about this medicine, talk to your doctor, pharmacist, or health care provider.  2020 Elsevier/Gold Standard (2018-04-03 11:39:19)

## 2019-10-02 NOTE — Progress Notes (Signed)
Cardiology Office Note:    Date:  10/02/2019   ID:  Shannon Hanson, DOB 25-Apr-1946, MRN [id number]  PCP:  Forrest Moron, MD  Cardiologist:  Garwin Brothers, MD   Referring MD: Forrest Moron, MD    ASSESSMENT:    1. Essential hypertension   2. Mild CAD   3. Mixed hyperlipidemia    PLAN:    In order of problems listed above:  1. Mild coronary artery disease: Secondary prevention stressed with the patient.  Importance of compliance with diet medication stressed and she vocalized understanding.  Exercise protocol explained and she was advised to walk at least half an hour a day at least 5 days a week and she promises to do so. 2. Essential hypertension: Blood pressure is stable and she is brought multiple readings from home which are excellent. 3. Mixed dyslipidemia: Diet was emphasized.  She'll have a Chem-7 and liver panel today.  Recent lipids were reviewed and in view of mild nonobstructive coronary artery disease I will initiate her on statin atorvastatin 10 mg daily she will back in 6 weeks for liver lipid check.Patient will be seen in follow-up appointment in 6 months or earlier if the patient has any concerns    Medication Adjustments/Labs and Tests Ordered: Current medicines are reviewed at length with the patient today.  Concerns regarding medicines are outlined above.  No orders of the defined types were placed in this encounter.  No orders of the defined types were placed in this encounter.    Chief Complaint  Patient presents with  . Follow-up    3 Months     History of Present Illness:    Shannon Hanson is a 74 y.o. female.  Patient has past medical history of essential hypertension and mild dyslipidemia.  She went to [facility] with chest pain and underwent CT coronary angiography which revealed mild disease.  She denies any problems at this time and takes care of activities of daily living.  No chest pain orthopnea or PND.  She has brought multiple blood  pressure readings and they are stable.  At the time of my evaluation, the patient is alert awake oriented and in no distress.  Past Medical History:  Diagnosis Date  . Chest pain 08/2019  . Hypertension     Past Surgical History:  Procedure Laterality Date  . ABDOMINAL HYSTERECTOMY    . CHOLECYSTECTOMY      Current Medications: Current Meds  Medication Sig  . acetaminophen (TYLENOL) 500 MG tablet Take 1,000 mg by mouth every 6 (six) hours as needed for mild pain or headache.   Marland Kitchen amLODipine (NORVASC) 10 MG tablet Take 1 tablet (10 mg total) by mouth daily.  Marland Kitchen aspirin EC 81 MG tablet Take 81 mg by mouth daily.  . Calcium Carbonate-Vitamin D (CALCIUM 500 + D) 500-125 MG-UNIT TABS Take 2 tablets by mouth every evening.   Marland Kitchen EPINEPHrine 0.3 mg/0.3 mL IJ SOAJ injection Inject 0.3 mg into the muscle as needed for anaphylaxis.   Marland Kitchen esomeprazole (NEXIUM 24HR CLEAR MINIS) 20 MG capsule Take 20 mg by mouth daily as needed (for GERD-like symptoms/indigestion).   . FIBER COMPLETE PO Take 2 capsules by mouth in the morning and at bedtime.   . fluticasone (FLONASE) 50 MCG/ACT nasal spray Place 1 spray into both nostrils daily as needed for allergies or rhinitis.   . hydrALAZINE (APRESOLINE) 25 MG tablet Take 1 tablet (25 mg total) by mouth daily as needed (for systolic BP (top  number) >160 and/or diastolic BP (bottom number) >100).  . loratadine (CLARITIN) 10 MG tablet Take 10 mg by mouth daily as needed for allergies or rhinitis.  Marland Kitchen losartan (COZAAR) 100 MG tablet Take 100 mg by mouth at bedtime.  . nitroGLYCERIN (NITROSTAT) 0.4 MG SL tablet Place 1 tablet (0.4 mg total) under the tongue every 5 (five) minutes as needed. (Patient taking differently: Place 0.4 mg under the tongue every 5 (five) minutes as needed for chest pain. )  . potassium chloride (KLOR-CON) 10 MEQ tablet Take 10 mEq by mouth daily.  . timolol (TIMOPTIC) 0.5 % ophthalmic solution Place 1 drop into both eyes in the morning and at  bedtime.   . triamterene-hydrochlorothiazide (MAXZIDE-25) 37.5-25 MG tablet Take 1 tablet by mouth in the morning.      Allergies:   Pregabalin, Shellfish allergy, Gabapentin, and Other   Social History   Socioeconomic History  . Marital status: Married    Spouse name: Not on file  . Number of children: Not on file  . Years of education: Not on file  . Highest education level: Not on file  Occupational History  . Not on file  Tobacco Use  . Smoking status: Never Smoker  . Smokeless tobacco: Never Used  Substance and Sexual Activity  . Alcohol use: No  . Drug use: Never  . Sexual activity: Not Currently  Other Topics Concern  . Not on file  Social History Narrative   Lives with husband. Never smoker.  Retired school principal.     Social Determinants of Corporate investment banker Strain:   . Difficulty of Paying Living Expenses:   Food Insecurity:   . Worried About Programme researcher, broadcasting/film/video in the Last Year:   . Barista in the Last Year:   Transportation Needs:   . Freight forwarder (Medical):   Marland Kitchen Lack of Transportation (Non-Medical):   Physical Activity:   . Days of Exercise per Week:   . Minutes of Exercise per Session:   Stress:   . Feeling of Stress :   Social Connections:   . Frequency of Communication with Friends and Family:   . Frequency of Social Gatherings with Friends and Family:   . Attends Religious Services:   . Active Member of Clubs or Organizations:   . Attends Banker Meetings:   Marland Kitchen Marital Status:      Family History: The patient's family history includes CAD (age of onset: 17) in her father; Heart disease (age of onset: 28) in her brother; Throat cancer in her brother and mother.  ROS:   Please see the history of present illness.    All other systems reviewed and are negative.  EKGs/Labs/Other Studies Reviewed:    The following studies were reviewed today: CLINICAL DATA:  74 year old female with h/o  hypertension, hyperlipidemia and chest pain.  EXAM: Cardiac/Coronary  CTA  TECHNIQUE: The patient was scanned on a [employer].  FINDINGS: A 100 kV prospective scan was triggered in the descending thoracic aorta at 111 HU's. Axial non-contrast 3 mm slices were carried out through the heart. The data set was analyzed on a dedicated work station and scored using the Agatson method. Gantry rotation speed was 250 msecs and collimation was .6 mm. No beta blockade and 0.8 mg of sl NTG was given. The 3D data set was reconstructed in 5% intervals of the 67-82 % of the R-R cycle. Diastolic phases were analyzed on a dedicated  work station using MPR, MIP and VRT modes. The patient received 80 cc of contrast.  Aorta:  Normal size.  No calcifications.  No dissection.  Aortic Valve:  Trileaflet.  No calcifications.  Coronary Arteries:  Normal coronary origin.  Right dominance.  RCA is a large dominant artery that gives rise to PDA and PLA. There is mild ostial calcified plaque extending into the right coronary sinus and associated with stenosis 0-25%. Proximal, mid and distal RCA have only minimal irregularities.  Left main is a large artery that gives rise to LAD, ramus intermedius and LCX arteries. Left main has no plaque.  LAD is a large vessel that has  minimal irregularities.  Ramus intermedius is medium caliber artery with minimal irregularities.  LCX is a non-dominant artery that has no plaque.  Other findings:  Normal pulmonary vein drainage into the left atrium.  Normal left atrial appendage without a thrombus.  IMPRESSION: 1. Coronary calcium score of 32. This was 7 percentile for age and sex matched control.  2. Normal coronary origin with right dominance.  3. CAD-RADS 1. Minimal non-obstructive CAD (0-24%) in the ostial RCA portion. Consider non-atherosclerotic causes of chest pain. Consider preventive therapy and risk factor  modification.  4. Mildly dilated pulmonary artery measuring 33 mm suggestive of pulmonary hypertension.   Electronically Signed   By: Ena Dawley   On: 09/17/2019 16:12   Recent Labs: 07/18/2019: TSH 3.250 09/17/2019: ALT 16 09/18/2019: BUN 16; Creatinine, Ser 0.79; Hemoglobin 13.4; Platelets 267; Potassium 3.7; Sodium 139  Recent Lipid Panel    Component Value Date/Time   CHOL 171 09/17/2019 0014   CHOL 154 07/18/2019 1015   TRIG 84 09/17/2019 0014   HDL 43 09/17/2019 0014   HDL 50 07/18/2019 1015   CHOLHDL 4.0 09/17/2019 0014   VLDL 17 09/17/2019 0014   LDLCALC 111 (H) 09/17/2019 0014   LDLCALC 90 07/18/2019 1015    Physical Exam:    VS:  BP (!) 144/80   Pulse (!) 54   Ht 5\' 7"  (1.702 m)   Wt 173 lb (78.5 kg)   SpO2 99%   BMI 27.10 kg/m     Wt Readings from Last 3 Encounters:  10/02/19 173 lb (78.5 kg)  09/18/19 171 lb 9.6 oz (77.8 kg)  07/18/19 175 lb (79.4 kg)     GEN: Patient is in no acute distress HEENT: Normal NECK: No JVD; No carotid bruits LYMPHATICS: No lymphadenopathy CARDIAC: Hear sounds regular, 2/6 systolic murmur at the apex. RESPIRATORY:  Clear to auscultation without rales, wheezing or rhonchi  ABDOMEN: Soft, non-tender, non-distended MUSCULOSKELETAL:  No edema; No deformity  SKIN: Warm and dry NEUROLOGIC:  Alert and oriented x 3 PSYCHIATRIC:  Normal affect   Signed, Jenean Lindau, MD  10/02/2019 11:01 AM    [address]

## 2019-10-03 LAB — HEPATIC FUNCTION PANEL
ALT: 11 IU/L (ref 0–32)
AST: 23 IU/L (ref 0–40)
Albumin: 4.3 g/dL (ref 3.7–4.7)
Alkaline Phosphatase: 77 IU/L (ref 39–117)
Bilirubin Total: 0.8 mg/dL (ref 0.0–1.2)
Bilirubin, Direct: 0.19 mg/dL (ref 0.00–0.40)
Total Protein: 7.2 g/dL (ref 6.0–8.5)

## 2019-10-03 LAB — BASIC METABOLIC PANEL
BUN/Creatinine Ratio: 21 (ref 12–28)
BUN: 18 mg/dL (ref 8–27)
CO2: 27 mmol/L (ref 20–29)
Calcium: 10.1 mg/dL (ref 8.7–10.3)
Chloride: 99 mmol/L (ref 96–106)
Creatinine, Ser: 0.86 mg/dL (ref 0.57–1.00)
GFR calc Af Amer: 78 mL/min/{1.73_m2} (ref >59)
GFR calc non Af Amer: 67 mL/min/{1.73_m2} (ref >59)
Glucose: 91 mg/dL (ref 65–99)
Potassium: 4.5 mmol/L (ref 3.5–5.2)
Sodium: 139 mmol/L (ref 134–144)

## 2019-11-14 ENCOUNTER — Telehealth: Payer: Self-pay

## 2019-11-14 LAB — BASIC METABOLIC PANEL
BUN/Creatinine Ratio: 20 (ref 12–28)
BUN: 18 mg/dL (ref 8–27)
CO2: 24 mmol/L (ref 20–29)
Calcium: 9.9 mg/dL (ref 8.7–10.3)
Chloride: 102 mmol/L (ref 96–106)
Creatinine, Ser: 0.92 mg/dL (ref 0.57–1.00)
GFR calc Af Amer: 71 mL/min/{1.73_m2} (ref >59)
GFR calc non Af Amer: 62 mL/min/{1.73_m2} (ref >59)
Glucose: 83 mg/dL (ref 65–99)
Potassium: 4.3 mmol/L (ref 3.5–5.2)
Sodium: 139 mmol/L (ref 134–144)

## 2019-11-14 LAB — LIPID PANEL
Chol/HDL Ratio: 2.6 ratio (ref 0.0–4.4)
Cholesterol, Total: 116 mg/dL (ref 100–199)
HDL: 44 mg/dL (ref >39)
LDL Chol Calc (NIH): 60 mg/dL (ref 0–99)
Triglycerides: 54 mg/dL (ref 0–149)
VLDL Cholesterol Cal: 12 mg/dL (ref 5–40)

## 2019-11-14 LAB — HEPATIC FUNCTION PANEL
ALT: 23 IU/L (ref 0–32)
AST: 24 IU/L (ref 0–40)
Albumin: 4.5 g/dL (ref 3.7–4.7)
Alkaline Phosphatase: 82 IU/L (ref 48–121)
Bilirubin Total: 0.7 mg/dL (ref 0.0–1.2)
Bilirubin, Direct: 0.21 mg/dL (ref 0.00–0.40)
Total Protein: 7.2 g/dL (ref 6.0–8.5)

## 2019-11-14 NOTE — Telephone Encounter (Signed)
Spoke with patient regarding results and recommendation.  Patient verbalizes understanding and is agreeable to plan of care. Advised patient to call back with any issues or concerns.  

## 2019-11-14 NOTE — Telephone Encounter (Signed)
-----   Message from Garwin Brothers, MD sent at 11/14/2019  7:58 AM EDT ----- The results of the study is unremarkable. Please inform patient. I will discuss in detail at next appointment. Cc  primary care/referring physician Garwin Brothers, MD 11/14/2019 7:58 AM

## 2020-03-11 ENCOUNTER — Other Ambulatory Visit: Payer: Self-pay | Admitting: Student

## 2020-03-11 NOTE — Telephone Encounter (Signed)
This is a [facility] pt, Dr. Revankar 

## 2020-04-02 ENCOUNTER — Other Ambulatory Visit: Payer: Self-pay

## 2020-04-02 DIAGNOSIS — I10 Essential (primary) hypertension: Secondary | ICD-10-CM | POA: Insufficient documentation

## 2020-04-05 ENCOUNTER — Other Ambulatory Visit: Payer: Self-pay

## 2020-04-05 ENCOUNTER — Ambulatory Visit: Payer: Medicare PPO | Admitting: Cardiology

## 2020-04-05 ENCOUNTER — Encounter: Payer: Self-pay | Admitting: Cardiology

## 2020-04-05 VITALS — BP 132/70 | HR 52 | Ht 67.0 in | Wt 175.0 lb

## 2020-04-05 DIAGNOSIS — I2510 Atherosclerotic heart disease of native coronary artery without angina pectoris: Secondary | ICD-10-CM | POA: Diagnosis not present

## 2020-04-05 DIAGNOSIS — I10 Essential (primary) hypertension: Secondary | ICD-10-CM

## 2020-04-05 DIAGNOSIS — E782 Mixed hyperlipidemia: Secondary | ICD-10-CM | POA: Diagnosis not present

## 2020-04-05 NOTE — Progress Notes (Signed)
Cardiology Office Note:    Date:  04/05/2020   ID:  Shannon Hanson, DOB 1946/01/24, MRN [id number]  PCP:  Forrest Moron, MD  Cardiologist:  Garwin Brothers, MD   Referring MD: Forrest Moron, MD    ASSESSMENT:    1. Mild CAD   2. Mixed hyperlipidemia   3. Essential hypertension    PLAN:    In order of problems listed above:  1. Coronary artery disease: Secondary prevention stressed with the patient.  Importance of compliance with diet medication stressed and she vocalized understanding.  She was told to exercise at least 30 minutes a day 5 days a week and she promises to do better. 2. Essential hypertension: Blood pressure stable and diet was emphasized 3. Mixed dyslipidemia: I reviewed lab work from the month of May from the [facility] sheet and it was fine and discussed with her at length. 4. Patient will be seen in follow-up appointment in 6 months or earlier if the patient has any concerns    Medication Adjustments/Labs and Tests Ordered: Current medicines are reviewed at length with the patient today.  Concerns regarding medicines are outlined above.  No orders of the defined types were placed in this encounter.  No orders of the defined types were placed in this encounter.    No chief complaint on file.    History of Present Illness:    Shannon Hanson is a 74 y.o. female.  She has past medical history of mild coronary artery disease, essential hypertension and dyslipidemia.  She denies any problems at this time and takes care of activities of daily living.  No chest pain orthopnea or PND.  She walks 20 minutes a day without any problems.  She occasionally has chest discomfort not related to exertion.  At the time of my evaluation, the patient is alert awake oriented and in no distress.  Past Medical History:  Diagnosis Date   Chest pain 08/2019   Chest pain of uncertain etiology 09/16/2019   Elevated troponin 09/18/2019   Essential hypertension 07/18/2019   Hammer  toe 02/02/2014   Overview:  Left 4th toe  Formatting of this note might be different from the original. Left 4th toe   Hyperlipidemia 09/18/2019   Hypertension    Lumbar radiculopathy 01/27/2015   Mild CAD 10/02/2019   Nuclear sclerotic cataract of right eye 02/02/2014   Added automatically from request for surgery [id number]  Formatting of this note might be different from the original. Added automatically from request for surgery [id number]   Primary osteoarthritis of left knee 03/14/2014   Status post laparoscopic cholecystectomy 01/31/2018   Thoracic or lumbosacral neuritis or radiculitis, unspecified 02/02/2014    Past Surgical History:  Procedure Laterality Date   ABDOMINAL HYSTERECTOMY     CHOLECYSTECTOMY      Current Medications: Current Meds  Medication Sig   acetaminophen (TYLENOL) 500 MG tablet Take 1,000 mg by mouth every 6 (six) hours as needed for mild pain or headache.    amLODipine (NORVASC) 10 MG tablet TAKE 1 TABLET(10 MG) BY MOUTH DAILY   aspirin EC 81 MG tablet Take 81 mg by mouth daily.   Calcium Carbonate-Vitamin D (CALCIUM 500 + D) 500-125 MG-UNIT TABS Take 2 tablets by mouth every evening.    EPINEPHrine 0.3 mg/0.3 mL IJ SOAJ injection Inject 0.3 mg into the muscle as needed for anaphylaxis.    FIBER COMPLETE PO Take 2 capsules by mouth in the morning and at bedtime.    fluticasone (  FLONASE) 50 MCG/ACT nasal spray Place 1 spray into both nostrils daily as needed for allergies or rhinitis.    hydrOXYzine (ATARAX/VISTARIL) 25 MG tablet Take 25 mg by mouth as needed.    loratadine (CLARITIN) 10 MG tablet Take 10 mg by mouth daily as needed for allergies or rhinitis.   losartan (COZAAR) 100 MG tablet Take 100 mg by mouth at bedtime.   potassium chloride (KLOR-CON) 10 MEQ tablet Take 10 mEq by mouth daily.   timolol (TIMOPTIC) 0.5 % ophthalmic solution Place 1 drop into both eyes in the morning and at bedtime.    triamterene-hydrochlorothiazide (MAXZIDE-25)  37.5-25 MG tablet Take 1 tablet by mouth in the morning.      Allergies:   Pregabalin, Shellfish allergy, Gabapentin, and Other   Social History   Socioeconomic History   Marital status: Married    Spouse name: Not on file   Number of children: Not on file   Years of education: Not on file   Highest education level: Not on file  Occupational History   Not on file  Tobacco Use   Smoking status: Never Smoker   Smokeless tobacco: Never Used  Vaping Use   Vaping Use: Never used  Substance and Sexual Activity   Alcohol use: No   Drug use: Never   Sexual activity: Not Currently  Other Topics Concern   Not on file  Social History Narrative   Lives with husband. Never smoker.  Retired Financial risk analyst.     Social Determinants of Corporate investment banker Strain:    Difficulty of Paying Living Expenses: Not on file  Food Insecurity:    Worried About Programme researcher, broadcasting/film/video in the Last Year: Not on file   [employer] of Food in the Last Year: Not on file  Transportation Needs:    Freight forwarder (Medical): Not on file   Lack of Transportation (Non-Medical): Not on file  Physical Activity:    Days of Exercise per Week: Not on file   Minutes of Exercise per Session: Not on file  Stress:    Feeling of Stress : Not on file  Social Connections:    Frequency of Communication with Friends and Family: Not on file   Frequency of Social Gatherings with Friends and Family: Not on file   Attends Religious Services: Not on file   Active Member of Clubs or Organizations: Not on file   Attends Banker Meetings: Not on file   Marital Status: Not on file     Family History: The patient's family history includes CAD (age of onset: 58) in her father; Heart disease (age of onset: 42) in her brother; Throat cancer in her brother and mother.  ROS:   Please see the history of present illness.    All other systems reviewed and are  negative.  EKGs/Labs/Other Studies Reviewed:    The following studies were reviewed today: I discussed my findings with the patient at length   Recent Labs: 07/18/2019: TSH 3.250 09/18/2019: Hemoglobin 13.4; Platelets 267 11/13/2019: ALT 23; BUN 18; Creatinine, Ser 0.92; Potassium 4.3; Sodium 139  Recent Lipid Panel    Component Value Date/Time   CHOL 116 11/13/2019 1040   TRIG 54 11/13/2019 1040   HDL 44 11/13/2019 1040   CHOLHDL 2.6 11/13/2019 1040   CHOLHDL 4.0 09/17/2019 0014   VLDL 17 09/17/2019 0014   LDLCALC 60 11/13/2019 1040    Physical Exam:    VS:  BP 132/70  Pulse (!) 52    Ht 5\' 7"  (1.702 m)    Wt 175 lb (79.4 kg)    SpO2 99%    BMI 27.41 kg/m     Wt Readings from Last 3 Encounters:  04/05/20 175 lb (79.4 kg)  10/02/19 173 lb (78.5 kg)  09/18/19 171 lb 9.6 oz (77.8 kg)     GEN: Patient is in no acute distress HEENT: Normal NECK: No JVD; No carotid bruits LYMPHATICS: No lymphadenopathy CARDIAC: Hear sounds regular, 2/6 systolic murmur at the apex. RESPIRATORY:  Clear to auscultation without rales, wheezing or rhonchi  ABDOMEN: Soft, non-tender, non-distended MUSCULOSKELETAL:  No edema; No deformity  SKIN: Warm and dry NEUROLOGIC:  Alert and oriented x 3 PSYCHIATRIC:  Normal affect   Signed, 09/20/19, MD  04/05/2020 1:22 PM    [facility]

## 2020-04-05 NOTE — Patient Instructions (Signed)

## 2020-06-21 NOTE — Telephone Encounter (Signed)
Losartan seems to make my lips tingle, feel warm and feel like they are swelling. Also some itching.  I took benadryl which helped somewhat but they still feel a little different.  I take 100mg   at night. I started noticing a couple weeks ago but wasn't happening every night.  Dr. would like to know if  20 mg of lisinopril daily would be okay?

## 2020-06-22 MED ORDER — CHLORTHALIDONE 25 MG PO TABS: 12.5000 mg | ORAL_TABLET | Freq: Every day | ORAL | 3 refills | Status: DC

## 2020-06-22 NOTE — Addendum Note (Signed)
Addended by: Eleonore Chiquito on: 06/22/2020 03:47 PM   Modules accepted: Orders

## 2020-06-23 NOTE — Telephone Encounter (Signed)
Outreach made to Pt.  Advised per Dr. Tomie China and pharmacist she should not start chlorthalidone.  She will continue her current medications without the losartan.   She will monitor her blood pressure daily and keep a log.  She will send her BP log in a week.  Pt thanked nurse for call back.

## 2020-09-18 ENCOUNTER — Other Ambulatory Visit: Payer: Self-pay | Admitting: Cardiology

## 2020-10-04 ENCOUNTER — Other Ambulatory Visit: Payer: Self-pay | Admitting: Cardiology

## 2020-10-04 NOTE — Telephone Encounter (Signed)
Amlodipine approved and sent 

## 2020-10-28 ENCOUNTER — Ambulatory Visit: Payer: Medicare PPO | Admitting: Cardiology

## 2020-10-29 ENCOUNTER — Other Ambulatory Visit: Payer: Self-pay

## 2020-10-29 ENCOUNTER — Ambulatory Visit: Payer: Medicare PPO | Admitting: Cardiology

## 2020-10-29 ENCOUNTER — Encounter: Payer: Self-pay | Admitting: Cardiology

## 2020-10-29 VITALS — BP 130/84 | HR 57 | Ht 67.0 in | Wt 171.0 lb

## 2020-10-29 DIAGNOSIS — E782 Mixed hyperlipidemia: Secondary | ICD-10-CM

## 2020-10-29 DIAGNOSIS — I10 Essential (primary) hypertension: Secondary | ICD-10-CM | POA: Diagnosis not present

## 2020-10-29 DIAGNOSIS — I2510 Atherosclerotic heart disease of native coronary artery without angina pectoris: Secondary | ICD-10-CM | POA: Diagnosis not present

## 2020-10-29 NOTE — Patient Instructions (Signed)

## 2020-10-29 NOTE — Progress Notes (Signed)
Cardiology Office Note:    Date:  10/29/2020   ID:  Shannon Hanson, DOB 08/07/1945, MRN [id number]  PCP:  Forrest Moron, MD  Cardiologist:  Garwin Brothers, MD   Referring MD: Forrest Moron, MD    ASSESSMENT:    1. Mild CAD   2. Essential hypertension   3. Mixed hyperlipidemia    PLAN:    In order of problems listed above:  1. Coronary artery disease: Secondary prevention stressed with the patient.  Importance of compliance with diet medication stressed and she vocalized understanding.  She exercises well and has good effort tolerance.  I reassured her about this. 2. Essential hypertension: Blood pressure stable.  Lifestyle modification was urged.  She has brought blood pressure readings from home and they are fine. 3. Mixed dyslipidemia: On statin therapy.  Recent blood work by primary care physician according to the patient was fine and she follows up with her MD about blood work for lipids. 4. Patient will be seen in follow-up appointment in 6 months or earlier if the patient has any concerns    Medication Adjustments/Labs and Tests Ordered: Current medicines are reviewed at length with the patient today.  Concerns regarding medicines are outlined above.  No orders of the defined types were placed in this encounter.  No orders of the defined types were placed in this encounter.    Chief Complaint  Patient presents with  . Follow-up     History of Present Illness:    Shannon Hanson is a 75 y.o. female.  Patient has past medical history of mild nonobstructive coronary artery disease, essential hypertension and dyslipidemia.  She denies any problems at this time and takes care of activities of daily living.  No chest pain orthopnea or PND.  She exercises on a regular basis at the gym without any symptoms.  At the time of my evaluation, the patient is alert awake oriented and in no distress.  Past Medical History:  Diagnosis Date  . Chest pain 08/2019  . Chest pain of  uncertain etiology 09/16/2019  . Elevated troponin 09/18/2019  . Essential hypertension 07/18/2019  . Hammer toe 02/02/2014   Overview:  Left 4th toe  Formatting of this note might be different from the original. Left 4th toe  . Hyperlipidemia 09/18/2019  . Hypertension   . Lumbar radiculopathy 01/27/2015  . Mild CAD 10/02/2019  . Nuclear sclerotic cataract of right eye 02/02/2014   Added automatically from request for surgery [id number]  Formatting of this note might be different from the original. Added automatically from request for surgery [id number]  . 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 Status post laparoscopic cholecystectomy 01/31/2018  . Thoracic or lumbosacral neuritis or radiculitis, unspecified 02/02/2014    Past Surgical History:  Procedure Laterality Date  . ABDOMINAL HYSTERECTOMY    . CHOLECYSTECTOMY      Current Medications: Current Meds  Medication Sig  . acetaminophen (TYLENOL) 500 MG tablet Take 1,000 mg by mouth every 6 (six) hours as needed for mild pain or headache.   Marland Kitchen amLODipine (NORVASC) 10 MG tablet Take 10 mg by mouth daily.  Marland Kitchen aspirin EC 81 MG tablet Take 81 mg by mouth daily.  Marland Kitchen atorvastatin (LIPITOR) 10 MG tablet Take 10 mg by mouth daily.  . Calcium Carbonate-Vitamin D 500-125 MG-UNIT TABS Take 2 tablets by mouth every evening.   Marland Kitchen EPINEPHrine 0.3 mg/0.3 mL IJ SOAJ injection Inject 0.3 mg into the muscle as needed for anaphylaxis.   Marland Kitchen  FIBER COMPLETE PO Take 2 capsules by mouth in the morning and at bedtime.   . fluticasone (FLONASE) 50 MCG/ACT nasal spray Place 1 spray into both nostrils daily as needed for allergies or rhinitis.   . hydrALAZINE (APRESOLINE) 25 MG tablet Take 25 mg by mouth as needed for high blood pressure.  . hydrOXYzine (ATARAX/VISTARIL) 25 MG tablet Take 25 mg by mouth as needed for itching.  . latanoprost (XALATAN) 0.005 % ophthalmic solution Place 1 drop into both eyes at bedtime.  Marland Kitchen loratadine (CLARITIN) 10 MG tablet Take 10 mg by  mouth daily as needed for allergies or rhinitis.  Marland Kitchen nitroGLYCERIN (NITROSTAT) 0.4 MG SL tablet Place 0.4 mg under the tongue every 5 (five) minutes as needed for chest pain.  . potassium chloride (KLOR-CON) 10 MEQ tablet Take 10 mEq by mouth daily.  Marland Kitchen triamterene-hydrochlorothiazide (MAXZIDE-25) 37.5-25 MG tablet Take 1 tablet by mouth in the morning.      Allergies:   Ace inhibitors, Pregabalin, Shellfish allergy, Gabapentin, Losartan, and Other   Social History   Socioeconomic History  . Marital status: Married    Spouse name: Not on file  . Number of children: Not on file  . Years of education: Not on file  . Highest education level: Not on file  Occupational History  . Not on file  Tobacco Use  . Smoking status: Never Smoker  . Smokeless tobacco: Never Used  Vaping Use  . Vaping Use: Never used  Substance and Sexual Activity  . Alcohol use: No  . Drug use: Never  . Sexual activity: Not Currently  Other Topics Concern  . Not on file  Social History Narrative   Lives with husband. Never smoker.  Retired Financial risk analyst.     Social Determinants of Corporate investment banker Strain: Not on [employer] Insecurity: Not on file  Transportation Needs: Not on file  Physical Activity: Not on file  Stress: Not on file  Social Connections: Not on file     Family History: The patient's family history includes CAD (age of onset: 46) in her father; Heart disease (age of onset: 60) in her brother; Throat cancer in her brother and mother.  ROS:   Please see the history of present illness.    All other systems reviewed and are negative.  EKGs/Labs/Other Studies Reviewed:    The following studies were reviewed today: EKG reveals sinus rhythm and nonspecific ST-T changes   Recent Labs: 11/13/2019: ALT 23; BUN 18; Creatinine, Ser 0.92; Potassium 4.3; Sodium 139  Recent Lipid Panel    Component Value Date/Time   CHOL 116 11/13/2019 1040   TRIG 54 11/13/2019 1040   HDL 44  11/13/2019 1040   CHOLHDL 2.6 11/13/2019 1040   CHOLHDL 4.0 09/17/2019 0014   VLDL 17 09/17/2019 0014   LDLCALC 60 11/13/2019 1040    Physical Exam:    VS:  BP 130/84 (BP Location: Right Arm, Patient Position: Sitting, Cuff Size: Normal)   Pulse (!) 57   Ht 5\' 7"  (1.702 m)   Wt 171 lb (77.6 kg)   SpO2 99%   BMI 26.78 kg/m     Wt Readings from Last 3 Encounters:  10/29/20 171 lb (77.6 kg)  04/05/20 175 lb (79.4 kg)  10/02/19 173 lb (78.5 kg)     GEN: Patient is in no acute distress HEENT: Normal NECK: No JVD; No carotid bruits LYMPHATICS: No lymphadenopathy CARDIAC: Hear sounds regular, 2/6 systolic murmur at the apex. RESPIRATORY:  Clear to auscultation without rales, wheezing or rhonchi  ABDOMEN: Soft, non-tender, non-distended MUSCULOSKELETAL:  No edema; No deformity  SKIN: Warm and dry NEUROLOGIC:  Alert and oriented x 3 PSYCHIATRIC:  Normal affect   Signed, Garwin Brothers, MD  10/29/2020 11:21 AM    [facility]

## 2021-04-22 ENCOUNTER — Other Ambulatory Visit: Payer: Self-pay | Admitting: Cardiology

## 2021-05-11 ENCOUNTER — Encounter: Payer: Self-pay | Admitting: Cardiology

## 2021-05-11 ENCOUNTER — Ambulatory Visit: Payer: Medicare PPO | Admitting: Cardiology

## 2021-05-11 ENCOUNTER — Other Ambulatory Visit: Payer: Self-pay

## 2021-05-11 VITALS — BP 140/76 | HR 70 | Ht 67.0 in | Wt 175.1 lb

## 2021-05-11 DIAGNOSIS — I10 Essential (primary) hypertension: Secondary | ICD-10-CM | POA: Diagnosis not present

## 2021-05-11 DIAGNOSIS — I209 Angina pectoris, unspecified: Secondary | ICD-10-CM

## 2021-05-11 DIAGNOSIS — E782 Mixed hyperlipidemia: Secondary | ICD-10-CM | POA: Diagnosis not present

## 2021-05-11 DIAGNOSIS — I2510 Atherosclerotic heart disease of native coronary artery without angina pectoris: Secondary | ICD-10-CM

## 2021-05-11 DIAGNOSIS — E559 Vitamin D deficiency, unspecified: Secondary | ICD-10-CM

## 2021-05-11 NOTE — Progress Notes (Signed)
Cardiology Office Note:    Date:  05/11/2021   ID:  Shannon Hanson, DOB Oct 06, 1945, MRN [id number]  PCP:  Shannon Moron, MD  Cardiologist:  Shannon Brothers, MD   Referring MD: Shannon Moron, MD    ASSESSMENT:    1. Essential hypertension   2. Mild CAD   3. Mixed hyperlipidemia   4. Angina pectoris (HCC)   5. Vitamin D deficiency    PLAN:    In order of problems listed above:  3 artery disease: Secondary prevention stressed to the patient.  Importance of compliance with diet medication stressed and she vocalized understanding.  She was advised to walk at least half an hour a day 5 days a week and she promises to do so. Palpitations: I advised her about Zio and preventice monitoring.  She is not keen on it.  I did explain to her at length the benefit of doing it but she is not too keen.  I respect her wishes.  I told her about Lourena Simmonds app and she promises to do that and monitor herself with the equipment and send results to [place]. Essential hypertension: Blood pressure stable and diet was emphasized.  Her blood pressures are much better at home. Mixed dyslipidemia: Diet was emphasized.  Lipids reviewed and will be checked today as she is fasting.  Weight reduction was stressed. Patient will be seen in follow-up appointment in 6 months or earlier if the patient has any concerns    Medication Adjustments/Labs and Tests Ordered: Current medicines are reviewed at length with the patient today.  Concerns regarding medicines are outlined above.  Orders Placed This Encounter  Procedures   Basic metabolic panel   CBC with Differential/Platelet   Hepatic function panel   Lipid panel   TSH   VITAMIN D 25 Hydroxy (Vit-D Deficiency, Fractures)    No orders of the defined types were placed in this encounter.    No chief complaint on file.    History of Present Illness:    Shannon Hanson is a 75 y.o. female.  Patient has past medical history of coronary artery disease which is mild  in nature, essential hypertension and dyslipidemia.  She denies any problems at this time and takes care of activities of daily living.  She tells me once in several weeks she has palpitations like episodes.  No orthopnea PND dizziness or any syncopal episodes.  At the time of my evaluation, the patient is alert awake oriented and in no distress.  She walks on a regular basis.  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 Left 4th toe   Hyperlipidemia 09/18/2019   Hypertension    Lumbar radiculopathy 01/27/2015   Mild CAD 10/02/2019   Nuclear sclerotic cataract of right eye 02/02/2014   Added automatically from request for surgery [id number]  Formatting of this note might be different from the original. Added automatically from request for surgery [id number]   Primary osteoarthritis of left knee 03/14/2014   Status post laparoscopic cholecystectomy 01/31/2018   Thoracic or lumbosacral neuritis or radiculitis, unspecified 02/02/2014    Past Surgical History:  Procedure Laterality Date   ABDOMINAL HYSTERECTOMY     CHOLECYSTECTOMY      Current Medications: Current Meds  Medication  Sig   acetaminophen (TYLENOL) 500 MG tablet Take 1,000 mg by mouth every 6 (six) hours as needed for mild pain or headache.    amLODipine (NORVASC) 10 MG tablet TAKE 1 TABLET(10 MG) BY MOUTH DAILY   aspirin EC 81 MG tablet Take 81 mg by mouth daily.   atorvastatin (LIPITOR) 10 MG tablet Take 10 mg by mouth daily.   Calcium Carbonate-Vitamin D 500-125 MG-UNIT TABS Take 2 tablets by mouth every evening.    diclofenac (VOLTAREN) 50 MG EC tablet Take 50 mg by mouth 3 (three) times daily as needed for mild pain.   EPINEPHrine 0.3 mg/0.3 mL IJ SOAJ injection Inject 0.3 mg into the muscle as needed  for anaphylaxis.    FIBER COMPLETE PO Take 2 capsules by mouth in the morning and at bedtime.    fluticasone (FLONASE) 50 MCG/ACT nasal spray Place 1 spray into both nostrils daily as needed for allergies or rhinitis.    hydrALAZINE (APRESOLINE) 25 MG tablet Take 25 mg by mouth as needed for high blood pressure.   hydrOXYzine (ATARAX/VISTARIL) 25 MG tablet Take 25 mg by mouth as needed for itching.   latanoprost (XALATAN) 0.005 % ophthalmic solution Place 1 drop into both eyes at bedtime.   loratadine (CLARITIN) 10 MG tablet Take 10 mg by mouth daily as needed for allergies or rhinitis.   nitroGLYCERIN (NITROSTAT) 0.4 MG SL tablet Place 0.4 mg under the tongue every 5 (five) minutes as needed for chest pain.   potassium chloride (KLOR-CON) 10 MEQ tablet Take 10 mEq by mouth daily.   triamterene-hydrochlorothiazide (MAXZIDE-25) 37.5-25 MG tablet Take 1 tablet by mouth in the morning.    zolpidem (AMBIEN) 5 MG tablet Take 5 mg by mouth at bedtime as needed for sleep.     Allergies:   Ace inhibitors, Pregabalin, Shellfish allergy, Gabapentin, Losartan, and Other   Social History   Socioeconomic History   Marital status: Married    Spouse name: Not on file   Number of children: Not on file   Years of education: Not on file   Highest education level: Not on file  Occupational History   Not on file  Tobacco Use   Smoking status: Never   Smokeless tobacco: Never  Vaping Use   Vaping Use: Never used  Substance and Sexual Activity   Alcohol use: No   Drug use: Never   Sexual activity: Not Currently  Other Topics Concern   Not on file  Social History Narrative   Lives with husband. Never smoker.  Retired Financial risk analyst.     Social Determinants of Corporate investment banker Strain: Not on [employer] Insecurity: Not on file  Transportation Needs: Not on file  Physical Activity: Not on file  Stress: Not on file  Social Connections: Not on file     Family History: The patient's  family history includes CAD (age of onset: 52) in her father; Heart disease (age of onset: 68) in her brother; Throat cancer in her brother and mother.  ROS:   Please see the history of present illness.    All other systems reviewed and are negative.  EKGs/Labs/Other Studies Reviewed:    The following studies were reviewed today: I discussed my findings with the patient at length   Recent Labs: No results found for requested labs within last 8760 hours.  Recent Lipid Panel    Component Value Date/Time   CHOL 116 11/13/2019 1040   TRIG 54 11/13/2019 1040  HDL 44 11/13/2019 1040   CHOLHDL 2.6 11/13/2019 1040   CHOLHDL 4.0 09/17/2019 0014   VLDL 17 09/17/2019 0014   LDLCALC 60 11/13/2019 1040    Physical Exam:    VS:  BP 140/76   Pulse 70   Ht 5\' 7"  (1.702 m)   Wt 175 lb 1.9 oz (79.4 kg)   SpO2 96%   BMI 27.43 kg/m     Wt Readings from Last 3 Encounters:  05/11/21 175 lb 1.9 oz (79.4 kg)  10/29/20 171 lb (77.6 kg)  04/05/20 175 lb (79.4 kg)     GEN: Patient is in no acute distress HEENT: Normal NECK: No JVD; No carotid bruits LYMPHATICS: No lymphadenopathy CARDIAC: Hear sounds regular, 2/6 systolic murmur at the apex. RESPIRATORY:  Clear to auscultation without rales, wheezing or rhonchi  ABDOMEN: Soft, non-tender, non-distended MUSCULOSKELETAL:  No edema; No deformity  SKIN: Warm and dry NEUROLOGIC:  Alert and oriented x 3 PSYCHIATRIC:  Normal affect   Signed, 06/05/20, MD  05/11/2021 11:40 AM    [facility]

## 2021-05-11 NOTE — Patient Instructions (Signed)
Medication Instructions:  Your physician recommends that you continue on your current medications as directed. Please refer to the Current Medication list given to you today.  *If you need a refill on your cardiac medications before your next appointment, please call your pharmacy*   Lab Work: Your physician recommends that you have labs done in the office today. Your test included  basic metabolic panel, complete blood count, TSH, vitamin D, liver function and lipids.  If you have labs (blood work) drawn today and your tests are completely normal, you will receive your results only by: MyChart Message (if you have MyChart) OR A paper copy in the mail If you have any lab test that is abnormal or we need to change your treatment, we will call you to review the results.   Testing/Procedures: None ordered   Follow-Up: At [facility], you and your health needs are our priority.  As part of our continuing mission to provide you with exceptional heart care, we have created designated Provider Care Teams.  These Care Teams include your primary Cardiologist (physician) and Advanced Practice Providers (APPs -  Physician Assistants and Nurse Practitioners) who all work together to provide you with the care you need, when you need it.  We recommend signing up for the patient portal called "MyChart".  Sign up information is provided on this After Visit Summary.  MyChart is used to connect with patients for Virtual Visits (Telemedicine).  Patients are able to view lab/test results, encounter notes, upcoming appointments, etc.  Non-urgent messages can be sent to your provider as well.   To learn more about what you can do with MyChart, go to [email or web address].    Your next appointment:   6 month(s)  The format for your next appointment:   In Person  Provider:   Belva Crome, MD   Other Instructions  FDA-cleared personal EKG: The world's most clinically validated personal EKG,  FDA-cleared to detect Atrial Fibrillation, Bradycardia, and Tachycardia. Mayford Knife is the most reliable way to check in on your heart from home. Take your EKG from anywhere: Capture a medical-grade EKG in 30 seconds and get an instant analysis right on your smartphone. Mayford Knife is small enough to fit in your pocket, so you can take it with you anywhere. Easy to use: Simply place your fingers on the sensors--no wires, patches, or gels. Recommended by doctors: A trusted resource, Mayford Knife is the #1 doctor-recommended personal EKG with more than 100 million EKGs recorded. Save or share your EKGs: With the press of a button, email your EKGs to your doctor or save them on your phone. Works with smartphones: Compatible with Event organiser and tablets. Check our compatibility chart. FSA/HSA eligible: Purchase using an FSA or HSA account (please confirm coverage with your insurance provider). Phone clip included with purchase, a $15 value. Conveniently take your device with you wherever you go.  [email or web address]  Step One- Record your EKG strip on [facility] app.   Step two- On Kardia EKG click "Download"   Step three- It will prompt you to make a password for this EKG. Please make the password "Dulce Sellar" so that we can view it.   Step four- Click on the little "upload" button (small box with an arrow in the middle) in the bottom left-hand corner of the screen.   Step five- Click "Save to Files"  Step six- Click on "On my iphone" and then "Pages" then press save in the top right-hand corner.   NOW  GO TO MYCHART   Once on MyChart click "Messages"  Step one- Click "Send a message"  Step two- Click "Ask a medical question"   Step three- Click "Non urgent medical question"   Step four- Click on [facility] name.  Step five- Click on the small paperclip at the bottom of the screen  Step six- Click "Choose file"  Step seven- Pick  the most recent EKG strip listed.   Once uploaded send the message!

## 2021-05-12 LAB — CBC WITH DIFFERENTIAL/PLATELET
Basophils Absolute: 0 10*3/uL (ref 0.0–0.2)
Basos: 0 %
EOS (ABSOLUTE): 0.1 10*3/uL (ref 0.0–0.4)
Eos: 2 %
Hematocrit: 43.2 % (ref 34.0–46.6)
Hemoglobin: 14.3 g/dL (ref 11.1–15.9)
Immature Grans (Abs): 0 10*3/uL (ref 0.0–0.1)
Immature Granulocytes: 0 %
Lymphocytes Absolute: 1.9 10*3/uL (ref 0.7–3.1)
Lymphs: 36 %
MCH: 29.1 pg (ref 26.6–33.0)
MCHC: 33.1 g/dL (ref 31.5–35.7)
MCV: 88 fL (ref 79–97)
Monocytes Absolute: 0.5 10*3/uL (ref 0.1–0.9)
Monocytes: 10 %
Neutrophils Absolute: 2.7 10*3/uL (ref 1.4–7.0)
Neutrophils: 52 %
Platelets: 275 10*3/uL (ref 150–450)
RBC: 4.91 x10E6/uL (ref 3.77–5.28)
RDW: 14.5 % (ref 11.7–15.4)
WBC: 5.2 10*3/uL (ref 3.4–10.8)

## 2021-05-12 LAB — BASIC METABOLIC PANEL
BUN/Creatinine Ratio: 23 (ref 12–28)
BUN: 18 mg/dL (ref 8–27)
CO2: 24 mmol/L (ref 20–29)
Calcium: 9.8 mg/dL (ref 8.7–10.3)
Chloride: 100 mmol/L (ref 96–106)
Creatinine, Ser: 0.78 mg/dL (ref 0.57–1.00)
Glucose: 84 mg/dL (ref 70–99)
Potassium: 4.1 mmol/L (ref 3.5–5.2)
Sodium: 141 mmol/L (ref 134–144)
eGFR: 79 mL/min/{1.73_m2} (ref >59)

## 2021-05-12 LAB — TSH: TSH: 1.93 u[IU]/mL (ref 0.450–4.500)

## 2021-05-12 LAB — HEPATIC FUNCTION PANEL
ALT: 13 IU/L (ref 0–32)
AST: 27 IU/L (ref 0–40)
Albumin: 4.7 g/dL (ref 3.7–4.7)
Alkaline Phosphatase: 84 IU/L (ref 44–121)
Bilirubin Total: 0.9 mg/dL (ref 0.0–1.2)
Bilirubin, Direct: 0.24 mg/dL (ref 0.00–0.40)
Total Protein: 6.9 g/dL (ref 6.0–8.5)

## 2021-05-12 LAB — LIPID PANEL
Chol/HDL Ratio: 2.7 ratio (ref 0.0–4.4)
Cholesterol, Total: 139 mg/dL (ref 100–199)
HDL: 52 mg/dL (ref >39)
LDL Chol Calc (NIH): 74 mg/dL (ref 0–99)
Triglycerides: 60 mg/dL (ref 0–149)
VLDL Cholesterol Cal: 13 mg/dL (ref 5–40)

## 2021-05-12 LAB — VITAMIN D 25 HYDROXY (VIT D DEFICIENCY, FRACTURES): Vit D, 25-Hydroxy: 54.6 ng/mL (ref 30.0–100.0)

## 2021-08-16 ENCOUNTER — Other Ambulatory Visit: Payer: Self-pay | Admitting: Cardiology

## 2021-09-11 ENCOUNTER — Other Ambulatory Visit: Payer: Self-pay | Admitting: Cardiology

## 2021-11-11 ENCOUNTER — Other Ambulatory Visit: Payer: Self-pay | Admitting: Cardiology

## 2021-12-15 ENCOUNTER — Encounter: Payer: Self-pay | Admitting: Cardiology

## 2021-12-15 ENCOUNTER — Ambulatory Visit: Payer: Medicare PPO | Admitting: Cardiology

## 2021-12-15 VITALS — BP 122/68 | HR 57 | Ht 67.0 in | Wt 174.1 lb

## 2021-12-15 DIAGNOSIS — E782 Mixed hyperlipidemia: Secondary | ICD-10-CM | POA: Diagnosis not present

## 2021-12-15 DIAGNOSIS — I2510 Atherosclerotic heart disease of native coronary artery without angina pectoris: Secondary | ICD-10-CM

## 2021-12-15 DIAGNOSIS — E559 Vitamin D deficiency, unspecified: Secondary | ICD-10-CM

## 2021-12-15 DIAGNOSIS — Z131 Encounter for screening for diabetes mellitus: Secondary | ICD-10-CM

## 2021-12-15 DIAGNOSIS — I10 Essential (primary) hypertension: Secondary | ICD-10-CM

## 2021-12-15 NOTE — Patient Instructions (Signed)
Medication Instructions:  Your physician recommends that you continue on your current medications as directed. Please refer to the Current Medication list given to you today.  *If you need a refill on your cardiac medications before your next appointment, please call your pharmacy*   Lab Work: Your physician recommends that you have labs done in the office today. Your test included  basic metabolic panel, complete blood count, TSH, liver function and lipids.   [facility] [address] 2nd floor M-W 8-11:30 and 1-4:30 and Thursday and Friday 8-11:30.  If you have labs (blood work) drawn today and your tests are completely normal, you will receive your results only by: MyChart Message (if you have MyChart) OR A paper copy in the mail If you have any lab test that is abnormal or we need to change your treatment, we will call you to review the results.   Testing/Procedures: None ordered   Follow-Up: At [facility], you and your health needs are our priority.  As part of our continuing mission to provide you with exceptional heart care, we have created designated Provider Care Teams.  These Care Teams include your primary Cardiologist (physician) and Advanced Practice Providers (APPs -  Physician Assistants and Nurse Practitioners) who all work together to provide you with the care you need, when you need it.  We recommend signing up for the patient portal called "MyChart".  Sign up information is provided on this After Visit Summary.  MyChart is used to connect with patients for Virtual Visits (Telemedicine).  Patients are able to view lab/test results, encounter notes, upcoming appointments, etc.  Non-urgent messages can be sent to your provider as well.   To learn more about what you can do with MyChart, go to [email or web address].    Your next appointment:   9 month(s)  The format for your next appointment:   In Person  Provider:   Rajan Revankar, MD   Other  Instructions NA  

## 2021-12-15 NOTE — Progress Notes (Signed)
Cardiology Office Note:    Date:  12/15/2021   ID:  Shannon Hanson, DOB [birth year], MRN [id number]  PCP:  Charleston Poot, MD  Cardiologist:  Jenean Lindau, MD   Referring MD: Charleston Poot, MD    ASSESSMENT:    1. Essential hypertension   2. Mixed hyperlipidemia   3. Mild CAD    PLAN:     In order of problems listed above:  Primary prevention stressed with the patient.  Importance of compliance with diet medication stressed to the patient and she vocalized understanding.  Questions were answered to her satisfaction.  She was advised to walk at least half an hour a day 5 days a week and she promises to do Essential hypertension: Blood pressure stable and diet was emphasized.  Lifestyle modification urged. Mixed dyslipidemia: On lipid-lowering medications.  Lipids are fine and she is fasting and will have blood work today.  We will do complete blood work.  She requests hemoglobin A1c and vitamin D and we will do this for her.  She gives history of vitamin D deficiency. Patient will be seen in follow-up appointment in 9 months or earlier if the patient has any concerns    Medication Adjustments/Labs and Tests Ordered: Current medicines are reviewed at length with the patient today.  Concerns regarding medicines are outlined above.  No orders of the defined types were placed in this encounter.  No orders of the defined types were placed in this encounter.    No chief complaint on file.    History of Present Illness:    Shannon Hanson is a 76 y.o. female.  Patient has past medical history of coronary artery disease, essential hypertension and dyslipidemia.  Patient denies any problems at this time and takes care of activities of daily living.  She walks on a regular basis.  No chest pain orthopnea or PND.  At the time of my evaluation, the patient is alert awake oriented and in no distress.  Past Medical History:  Diagnosis Date   Chest pain 08/2019   Chest pain of  uncertain etiology 123XX123   Elevated troponin 09/18/2019   Essential hypertension 07/18/2019   Hammer toe 02/02/2014   Overview:  Left 4th toe  Formatting of this note might be different from the original. Left 4th toe   Hyperlipidemia 09/18/2019   Hypertension    Lumbar radiculopathy 01/27/2015   Mild CAD 10/02/2019   Nuclear sclerotic cataract of right eye 02/02/2014   Added automatically from request for surgery [id number]  Formatting of this note might be different from the original. Added automatically from request for surgery [id number]   Primary osteoarthritis of left knee 03/14/2014   Status post laparoscopic cholecystectomy 01/31/2018   Thoracic or lumbosacral neuritis or radiculitis, unspecified 02/02/2014    Past Surgical History:  Procedure Laterality Date   ABDOMINAL HYSTERECTOMY     CHOLECYSTECTOMY      Current Medications: Current Meds  Medication Sig   acetaminophen (TYLENOL) 500 MG tablet Take 1,000 mg by mouth every 6 (six) hours as needed for mild pain or headache.    amLODipine (NORVASC) 10 MG tablet Take 1 tablet (10 mg total) by mouth daily.   aspirin EC 81 MG tablet Take 81 mg by mouth daily.   atorvastatin (LIPITOR) 10 MG tablet Take 1 tablet (10 mg total) by mouth daily.   Calcium Carbonate-Vitamin D 500-125 MG-UNIT TABS Take 2 tablets by mouth every evening.    diclofenac (VOLTAREN) 50 MG EC tablet Take  50 mg by mouth 3 (three) times daily as needed for mild pain.   EPINEPHrine 0.3 mg/0.3 mL IJ SOAJ injection Inject 0.3 mg into the muscle as needed for anaphylaxis.    FIBER COMPLETE PO Take 2 capsules by mouth in the morning and at bedtime.    fluticasone (FLONASE) 50 MCG/ACT nasal spray Place 1 spray into both nostrils daily as needed for allergies or rhinitis.    hydrALAZINE (APRESOLINE) 25 MG tablet Take 25 mg by mouth as needed for high blood pressure.   hydrOXYzine (ATARAX/VISTARIL) 25 MG tablet Take 25 mg by mouth as needed for itching.   latanoprost (XALATAN) 0.005  % ophthalmic solution Place 1 drop into both eyes at bedtime.   loratadine (CLARITIN) 10 MG tablet Take 10 mg by mouth daily as needed for allergies or rhinitis.   nitroGLYCERIN (NITROSTAT) 0.4 MG SL tablet Place 0.4 mg under the tongue every 5 (five) minutes as needed for chest pain.   potassium chloride (KLOR-CON) 10 MEQ tablet Take 10 mEq by mouth daily.   triamterene-hydrochlorothiazide (MAXZIDE-25) 37.5-25 MG tablet Take 1 tablet by mouth in the morning.    zolpidem (AMBIEN) 5 MG tablet Take 5 mg by mouth at bedtime as needed for sleep.     Allergies:   Ace inhibitors, Pregabalin, Shellfish allergy, Gabapentin, Losartan, and Other   Social History   Socioeconomic History   Marital status: Married    Spouse name: Not on file   Number of children: Not on file   Years of education: Not on file   Highest education level: Not on file  Occupational History   Not on file  Tobacco Use   Smoking status: Never   Smokeless tobacco: Never  Vaping Use   Vaping Use: Never used  Substance and Sexual Activity   Alcohol use: No   Drug use: Never   Sexual activity: Not Currently  Other Topics Concern   Not on file  Social History Narrative   Lives with husband. Never smoker.  Retired Financial risk analyst.     Social Determinants of Corporate investment banker Strain: Not on [employer] Insecurity: Not on file  Transportation Needs: Not on file  Physical Activity: Not on file  Stress: Not on file  Social Connections: Not on file     Family History: The patient's family history includes CAD (age of onset: 51) in her father; Heart disease (age of onset: 93) in her brother; Throat cancer in her brother and mother.  ROS:   Please see the history of present illness.    All other systems reviewed and are negative.  EKGs/Labs/Other Studies Reviewed:    The following studies were reviewed today: EKG reveals sinus rhythm and nonspecific ST-T changes   Recent Labs: 05/11/2021: ALT 13;  BUN 18; Creatinine, Ser 0.78; Hemoglobin 14.3; Platelets 275; Potassium 4.1; Sodium 141; TSH 1.930  Recent Lipid Panel    Component Value Date/Time   CHOL 139 05/11/2021 1138   TRIG 60 05/11/2021 1138   HDL 52 05/11/2021 1138   CHOLHDL 2.7 05/11/2021 1138   CHOLHDL 4.0 09/17/2019 0014   VLDL 17 09/17/2019 0014   LDLCALC 74 05/11/2021 1138    Physical Exam:    VS:  BP 122/68   Pulse (!) 57   Ht 5\' 7"  (1.702 m)   Wt 174 lb 1.9 oz (79 kg)   SpO2 98%   BMI 27.27 kg/m     Wt Readings from Last 3 Encounters:  12/15/21  174 lb 1.9 oz (79 kg)  05/11/21 175 lb 1.9 oz (79.4 kg)  10/29/20 171 lb (77.6 kg)     GEN: Patient is in no acute distress HEENT: Normal NECK: No JVD; No carotid bruits LYMPHATICS: No lymphadenopathy CARDIAC: Hear sounds regular, 2/6 systolic murmur at the apex. RESPIRATORY:  Clear to auscultation without rales, wheezing or rhonchi  ABDOMEN: Soft, non-tender, non-distended MUSCULOSKELETAL:  No edema; No deformity  SKIN: Warm and dry NEUROLOGIC:  Alert and oriented x 3 PSYCHIATRIC:  Normal affect   Signed, Garwin Brothers, MD  12/15/2021 10:40 AM    [facility]

## 2021-12-16 LAB — BASIC METABOLIC PANEL
BUN/Creatinine Ratio: 22 (ref 12–28)
BUN: 20 mg/dL (ref 8–27)
CO2: 27 mmol/L (ref 20–29)
Calcium: 10.4 mg/dL — ABNORMAL HIGH (ref 8.7–10.3)
Chloride: 100 mmol/L (ref 96–106)
Creatinine, Ser: 0.92 mg/dL (ref 0.57–1.00)
Glucose: 92 mg/dL (ref 70–99)
Potassium: 4.3 mmol/L (ref 3.5–5.2)
Sodium: 140 mmol/L (ref 134–144)
eGFR: 65 mL/min/{1.73_m2} (ref >59)

## 2021-12-16 LAB — HEPATIC FUNCTION PANEL
ALT: 16 IU/L (ref 0–32)
AST: 28 IU/L (ref 0–40)
Albumin: 4.7 g/dL (ref 3.7–4.7)
Alkaline Phosphatase: 80 IU/L (ref 44–121)
Bilirubin Total: 1 mg/dL (ref 0.0–1.2)
Bilirubin, Direct: 0.25 mg/dL (ref 0.00–0.40)
Total Protein: 7.3 g/dL (ref 6.0–8.5)

## 2021-12-16 LAB — LIPID PANEL
Chol/HDL Ratio: 2.6 ratio (ref 0.0–4.4)
Cholesterol, Total: 144 mg/dL (ref 100–199)
HDL: 55 mg/dL (ref >39)
LDL Chol Calc (NIH): 77 mg/dL (ref 0–99)
Triglycerides: 57 mg/dL (ref 0–149)
VLDL Cholesterol Cal: 12 mg/dL (ref 5–40)

## 2021-12-16 LAB — HEMOGLOBIN A1C
Est. average glucose Bld gHb Est-mCnc: 94 mg/dL
Hgb A1c MFr Bld: 4.9 % (ref 4.8–5.6)

## 2021-12-16 LAB — TSH: TSH: 2.15 u[IU]/mL (ref 0.450–4.500)

## 2021-12-16 LAB — VITAMIN D 25 HYDROXY (VIT D DEFICIENCY, FRACTURES): Vit D, 25-Hydroxy: 66.1 ng/mL (ref 30.0–100.0)

## 2021-12-19 ENCOUNTER — Encounter (HOSPITAL_BASED_OUTPATIENT_CLINIC_OR_DEPARTMENT_OTHER): Payer: Self-pay

## 2021-12-19 ENCOUNTER — Emergency Department (HOSPITAL_BASED_OUTPATIENT_CLINIC_OR_DEPARTMENT_OTHER)
Admission: EM | Admit: 2021-12-19 | Discharge: 2021-12-19 | Disposition: A | Discharge status: Home / Self Care | Payer: Medicare PPO | Attending: Emergency Medicine | Admitting: Emergency Medicine

## 2021-12-19 ENCOUNTER — Other Ambulatory Visit: Payer: Self-pay

## 2021-12-19 ENCOUNTER — Emergency Department (HOSPITAL_BASED_OUTPATIENT_CLINIC_OR_DEPARTMENT_OTHER): Payer: Medicare PPO

## 2021-12-19 DIAGNOSIS — I10 Essential (primary) hypertension: Secondary | ICD-10-CM | POA: Diagnosis not present

## 2021-12-19 DIAGNOSIS — Z7982 Long term (current) use of aspirin: Secondary | ICD-10-CM | POA: Diagnosis not present

## 2021-12-19 DIAGNOSIS — R109 Unspecified abdominal pain: Secondary | ICD-10-CM | POA: Insufficient documentation

## 2021-12-19 DIAGNOSIS — Z79899 Other long term (current) drug therapy: Secondary | ICD-10-CM | POA: Insufficient documentation

## 2021-12-19 DIAGNOSIS — M545 Low back pain, unspecified: Secondary | ICD-10-CM | POA: Insufficient documentation

## 2021-12-19 LAB — URINALYSIS, ROUTINE W REFLEX MICROSCOPIC
Bilirubin Urine: NEGATIVE
Glucose, UA: NEGATIVE mg/dL
Hgb urine dipstick: NEGATIVE
Ketones, ur: NEGATIVE mg/dL
Nitrite: NEGATIVE
Protein, ur: NEGATIVE mg/dL
Specific Gravity, Urine: 1.015 (ref 1.005–1.030)
pH: 7 (ref 5.0–8.0)

## 2021-12-19 LAB — URINALYSIS, MICROSCOPIC (REFLEX)

## 2021-12-19 MED ORDER — LIDOCAINE 5 % EX PTCH
1.0000 | MEDICATED_PATCH | CUTANEOUS | Status: DC
Start: 2021-12-19 — End: 2021-12-19
  Administered 2021-12-19: 1 via TRANSDERMAL
  Filled 2021-12-19: qty 1

## 2021-12-19 MED ORDER — LIDOCAINE 4 % EX PTCH
1.0000 | MEDICATED_PATCH | CUTANEOUS | 0 refills | Status: AC
Start: 2021-12-19 — End: 2021-12-26

## 2021-12-19 MED ORDER — CEPHALEXIN 500 MG PO CAPS: 500.0000 mg | ORAL_CAPSULE | Freq: Three times a day (TID) | ORAL | 0 refills | Status: AC

## 2022-03-04 IMAGING — CT CT HEART MORP W/ CTA COR W/ SCORE W/ CA W/CM &/OR W/O CM
2 of 7 series · 11 of 20 positions shown, 13 images · IV contrast (APPLIED)
Comparison: CT abdomen 10/23/2016.
COMPARISON: CT abdomen 10/23/2016.

Addendum:
EXAM:
OVER-READ INTERPRETATION  CT CHEST

The following report is an over-read performed by radiologist Dr.
Naoshi Mll [REDACTED] on 09/17/2019. This
over-read does not include interpretation of cardiac or coronary
anatomy or pathology. The coronary CTA interpretation by the
cardiologist is attached.
CLINICAL DATA: 73-year-old female with h/o hypertension,
hyperlipidemia and chest pain.
Cardiac/Coronary  CTA
TECHNIQUE: The patient was scanned on a Phillips Force scanner.

[Series 8: 0-90% · axial · 0.39mm/px · z∈[+1109,+1196]mm · 5 of 2180 slices shown]
[im 364/2180  vessel]
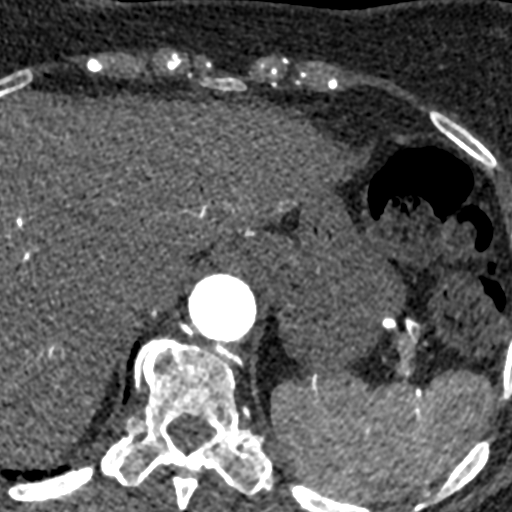
[im 727/2180  vessel]
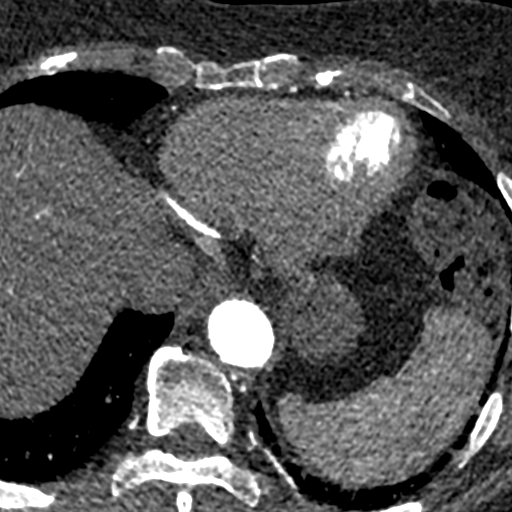
[im 1090/2180  vessel]
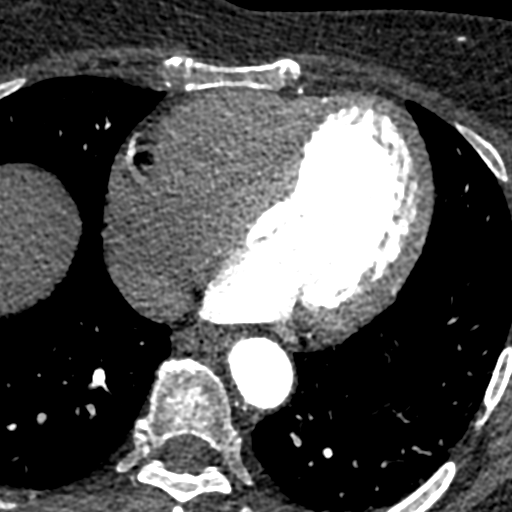
[im 1453/2180  vessel]
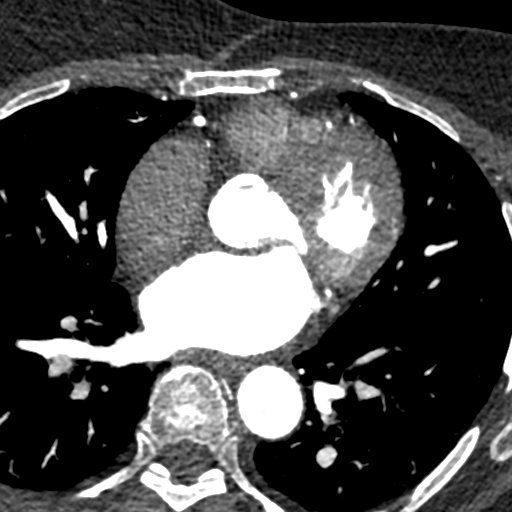
[im 1816/2180  vessel]
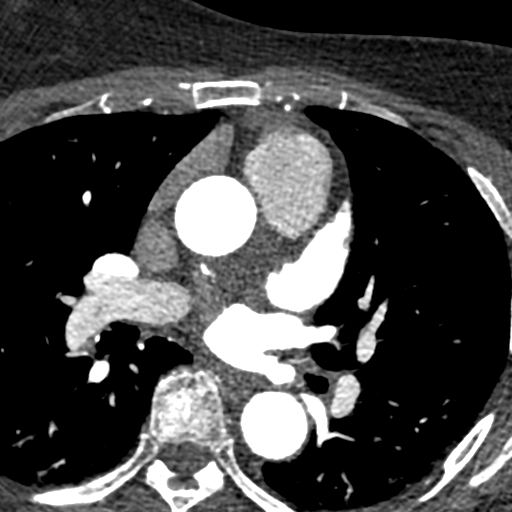

[Series 9: 5-95% · axial · 0.39mm/px · z∈[+1106,+1199]mm · 6 of 2180 slices shown, 8 images]
[im 312/2180  vessel]
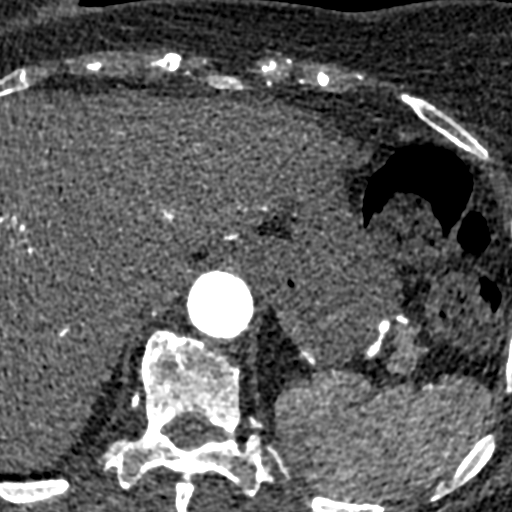
[im 312/2180  lung]
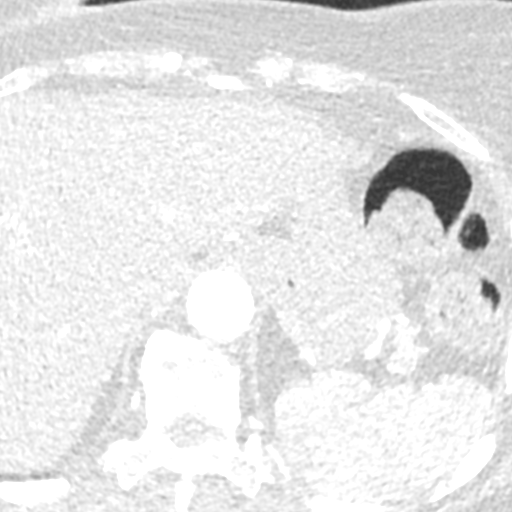
[im 623/2180  vessel]
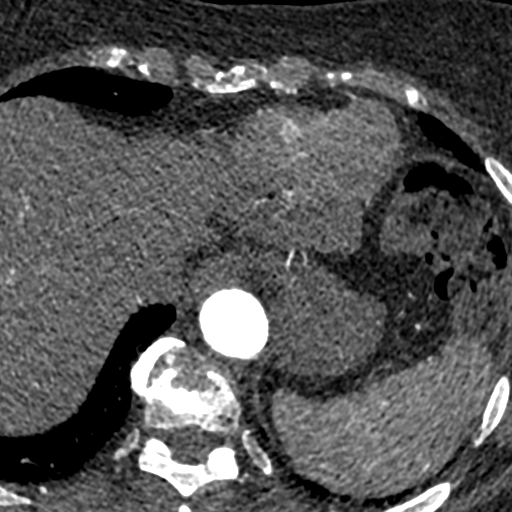
[im 934/2180  vessel]
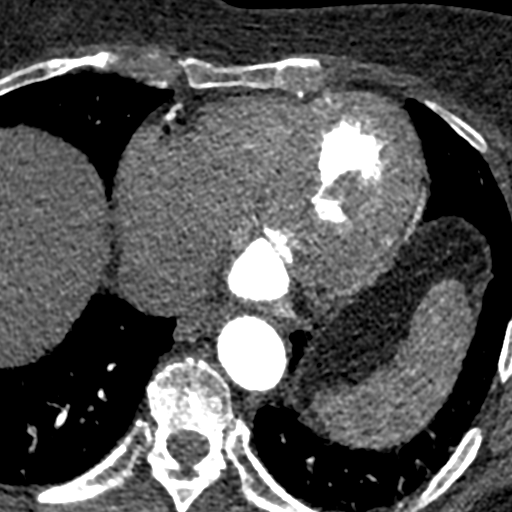
[im 1246/2180  vessel]
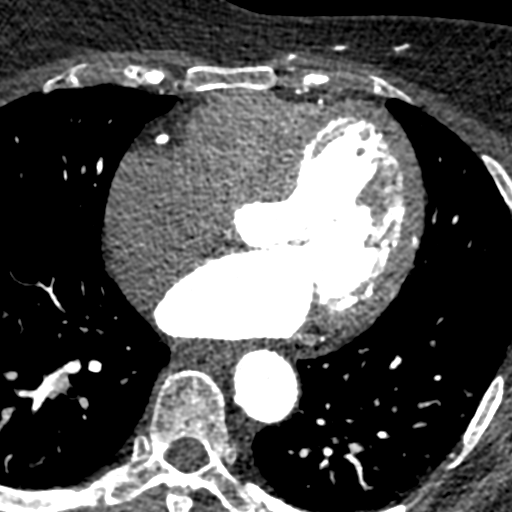
[im 1557/2180  vessel]
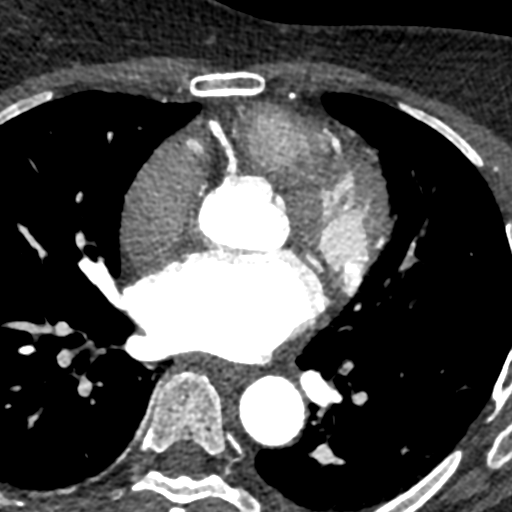
[im 1557/2180  lung]
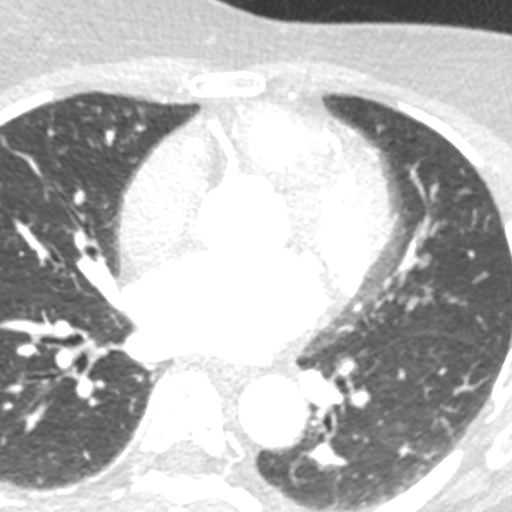
[im 1868/2180  vessel]
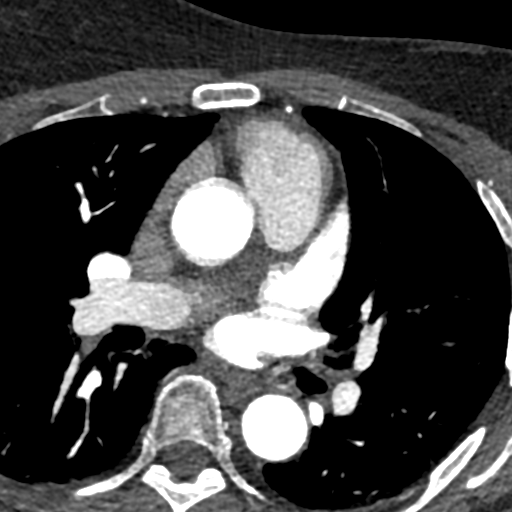

[11 of 20 positions shown; findings below may reference images not displayed]

FINDINGS: Limited view of the lung parenchyma demonstrates subpleural nodule
in the RIGHT lower lobe measuring 4 mm (image [DATE]) which is not
changed from comparison CT. Mild subpleural reticulation in the LEFT
and RIGHT lower lobe is favored benign. Airways are normal.

Limited view of the mediastinum demonstrates no adenopathy.
Esophagus normal.

Limited view of the upper abdomen unremarkable.

Limited view of the skeleton and chest wall is unremarkable.
IMPRESSION: 1. Stable pulmonary nodule RIGHT lower lobe.
2. Subpleural reticulation consistent with mild pulmonary
inflammation.
FINDINGS: A 100 kV prospective scan was triggered in the descending thoracic
aorta at 111 HU's. Axial non-contrast 3 mm slices were carried out
through the heart. The data set was analyzed on a dedicated work
station and scored using the Agatson method. Gantry rotation speed
was 250 msecs and collimation was .6 mm. No beta blockade and 0.8 mg
of sl NTG was given. The 3D data set was reconstructed in 5%
intervals of the 67-82 % of the R-R cycle. Diastolic phases were
analyzed on a dedicated work station using MPR, MIP and VRT modes.
The patient received 80 cc of contrast.

Aorta:  Normal size.  No calcifications.  No dissection.

Aortic Valve:  Trileaflet.  No calcifications.

Coronary Arteries:  Normal coronary origin.  Right dominance.

RCA is a large dominant artery that gives rise to PDA and PLA. There
is mild ostial calcified plaque extending into the right coronary
sinus and associated with stenosis 0-25%. Proximal, mid and distal
RCA have only minimal irregularities.

Left main is a large artery that gives rise to LAD, ramus
intermedius and LCX arteries. Left main has no plaque.

LAD is a large vessel that has  minimal irregularities.

Ramus intermedius is medium caliber artery with minimal
irregularities.

LCX is a non-dominant artery that has no plaque.

Other findings:

Normal pulmonary vein drainage into the left atrium.

Normal left atrial appendage without a thrombus.
IMPRESSION: 1. Coronary calcium score of 32. This was 59 percentile for age and
sex matched control.

2. Normal coronary origin with right dominance.

3. CAD-RADS 1. Minimal non-obstructive CAD (0-24%) in the ostial RCA
portion. Consider non-atherosclerotic causes of chest pain. Consider
preventive therapy and risk factor modification.

4. Mildly dilated pulmonary artery measuring 33 mm suggestive of
pulmonary hypertension.

*** End of Addendum ***
EXAM:
OVER-READ INTERPRETATION  CT CHEST

The following report is an over-read performed by radiologist Dr.
Naoshi Mll [REDACTED] on 09/17/2019. This
over-read does not include interpretation of cardiac or coronary
anatomy or pathology. The coronary CTA interpretation by the
cardiologist is attached.
FINDINGS: Limited view of the lung parenchyma demonstrates subpleural nodule
in the RIGHT lower lobe measuring 4 mm (image [DATE]) which is not
changed from comparison CT. Mild subpleural reticulation in the LEFT
and RIGHT lower lobe is favored benign. Airways are normal.

Limited view of the mediastinum demonstrates no adenopathy.
Esophagus normal.

Limited view of the upper abdomen unremarkable.

Limited view of the skeleton and chest wall is unremarkable.
IMPRESSION: 1. Stable pulmonary nodule RIGHT lower lobe.
2. Subpleural reticulation consistent with mild pulmonary
inflammation.

## 2022-05-09 ENCOUNTER — Other Ambulatory Visit: Payer: Self-pay | Admitting: Cardiology

## 2022-06-10 ENCOUNTER — Other Ambulatory Visit: Payer: Self-pay | Admitting: Cardiology

## 2022-08-28 ENCOUNTER — Other Ambulatory Visit: Payer: Self-pay

## 2022-09-13 ENCOUNTER — Ambulatory Visit: Payer: Medicare PPO | Attending: Cardiology | Admitting: Cardiology

## 2022-09-13 ENCOUNTER — Encounter: Payer: Self-pay | Admitting: Cardiology

## 2022-09-13 VITALS — BP 154/68 | HR 62 | Ht 67.0 in | Wt 173.0 lb

## 2022-09-13 DIAGNOSIS — E782 Mixed hyperlipidemia: Secondary | ICD-10-CM

## 2022-09-13 DIAGNOSIS — I2510 Atherosclerotic heart disease of native coronary artery without angina pectoris: Secondary | ICD-10-CM | POA: Diagnosis not present

## 2022-09-13 DIAGNOSIS — I10 Essential (primary) hypertension: Secondary | ICD-10-CM

## 2022-09-13 MED ORDER — NITROGLYCERIN 0.4 MG SL SUBL: 0.4000 mg | SUBLINGUAL_TABLET | SUBLINGUAL | 11 refills | Status: AC | PRN

## 2022-09-13 MED ORDER — AMLODIPINE BESYLATE 10 MG PO TABS: 10.0000 mg | ORAL_TABLET | Freq: Every day | ORAL | 3 refills | Status: DC

## 2022-09-13 MED ORDER — ATORVASTATIN CALCIUM 10 MG PO TABS: 10.0000 mg | ORAL_TABLET | Freq: Every day | ORAL | 3 refills | Status: DC

## 2022-09-13 NOTE — Progress Notes (Signed)
Cardiology Office Note:    Date:  09/13/2022   ID:  Shannon Hanson, DOB 1945/11/19, MRN [id number]  PCP:  Meliton Rattan, MD  Cardiologist:  Jenean Lindau, MD   Referring MD: Charleston Poot, MD    ASSESSMENT:    1. Essential hypertension   2. Mild CAD   3. Mixed hyperlipidemia    PLAN:    In order of problems listed above:  Mild coronary artery disease: Secondary prevention stressed with the patient.  Importance of compliance with diet medication stressed and she vocalized understanding.  She was advised to continue her excellent exercise program. Essential hypertension: Blood pressure is stable and diet was emphasized.  Lifestyle modification urged. Mixed dyslipidemia: On lipid-lowering medications followed by primary care.  She is fasting and will have blood work today. Patient will be seen in follow-up appointment in 6 months or earlier if the patient has any concerns.    Medication Adjustments/Labs and Tests Ordered: Current medicines are reviewed at length with the patient today.  Concerns regarding medicines are outlined above.  No orders of the defined types were placed in this encounter.  No orders of the defined types were placed in this encounter.    No chief complaint on file.    History of Present Illness:    Shannon Hanson is a 77 y.o. female.  Patient has past medical history of mild coronary artery disease, essential hypertension and mixed dyslipidemia.  She denies any problems at this time and takes care of activities of daily living.  No chest pain orthopnea or PND.  She now exercises very well 5 days a week.  At the time of my evaluation, the patient is alert awake oriented and in no distress.  Past Medical History:  Diagnosis Date   Chest pain 08/2019   Chest pain of uncertain etiology 123XX123   Elevated troponin 09/18/2019   Essential hypertension 07/18/2019   Hammer toe 02/02/2014   Overview:  Left 4th toe  Formatting of this note might be  different from the original. Left 4th toe   Hyperlipidemia 09/18/2019   Hypertension    Lumbar radiculopathy 01/27/2015   Mild CAD 10/02/2019   Nuclear sclerotic cataract of right eye 02/02/2014   Added automatically from request for surgery [id number]  Formatting of this note might be different from the original. Added automatically from request for surgery [id number]   Primary osteoarthritis of left knee 03/14/2014   Status post laparoscopic cholecystectomy 01/31/2018   Thoracic or lumbosacral neuritis or radiculitis, unspecified 02/02/2014    Past Surgical History:  Procedure Laterality Date   ABDOMINAL HYSTERECTOMY     CHOLECYSTECTOMY      Current Medications: Current Meds  Medication Sig   acetaminophen (TYLENOL) 500 MG tablet Take 1,000 mg by mouth every 6 (six) hours as needed for mild pain or headache.    amLODipine (NORVASC) 10 MG tablet Take 1 tablet (10 mg total) by mouth daily.   aspirin EC 81 MG tablet Take 81 mg by mouth daily.   atorvastatin (LIPITOR) 10 MG tablet TAKE 1 TABLET(10 MG) BY MOUTH DAILY   Calcium Carbonate-Vitamin D 500-125 MG-UNIT TABS Take 2 tablets by mouth every evening.    diclofenac (VOLTAREN) 50 MG EC tablet Take 50 mg by mouth 3 (three) times daily as needed for mild pain.   EPINEPHrine 0.3 mg/0.3 mL IJ SOAJ injection Inject 0.3 mg into the muscle as needed for anaphylaxis.    FIBER COMPLETE PO Take 2 capsules by mouth in  the morning and at bedtime.    fluticasone (FLONASE) 50 MCG/ACT nasal spray Place 1 spray into both nostrils daily as needed for allergies or rhinitis.    latanoprost (XALATAN) 0.005 % ophthalmic solution Place 1 drop into both eyes at bedtime.   loratadine (CLARITIN) 10 MG tablet Take 10 mg by mouth daily as needed for allergies or rhinitis.   mirtazapine (REMERON) 15 MG tablet Take 15 mg by mouth 2 (two) times daily.   nitroGLYCERIN (NITROSTAT) 0.4 MG SL tablet Place 0.4 mg under the tongue every 5 (five) minutes as needed for chest pain.    potassium chloride (KLOR-CON) 10 MEQ tablet Take 10 mEq by mouth daily.   triamcinolone cream (KENALOG) 0.1 % Apply 1 Application topically 2 (two) times daily.   triamterene-hydrochlorothiazide (MAXZIDE-25) 37.5-25 MG tablet Take 1 tablet by mouth in the morning.      Allergies:   Ace inhibitors, Pregabalin, Shellfish allergy, Gabapentin, Losartan, and Other   Social History   Socioeconomic History   Marital status: Married    Spouse name: Not on file   Number of children: Not on file   Years of education: Not on file   Highest education level: Not on file  Occupational History   Not on file  Tobacco Use   Smoking status: Never   Smokeless tobacco: Never  Vaping Use   Vaping Use: Never used  Substance and Sexual Activity   Alcohol use: No   Drug use: Never   Sexual activity: Not Currently  Other Topics Concern   Not on file  Social History Narrative   Lives with husband. Never smoker.  Retired Programmer, multimedia.     Social Determinants of Radio broadcast assistant Strain: Not on [employer] Insecurity: Not on file  Transportation Needs: Not on file  Physical Activity: Not on file  Stress: Not on file  Social Connections: Not on file     Family History: The patient's family history includes CAD (age of onset: 35) in her father; Heart disease (age of onset: 98) in her brother; Throat cancer in her brother and mother.  ROS:   Please see the history of present illness.    All other systems reviewed and are negative.  EKGs/Labs/Other Studies Reviewed:    The following studies were reviewed today: I discussed my findings with the patient at length.   Recent Labs: 12/15/2021: ALT 16; BUN 20; Creatinine, Ser 0.92; Potassium 4.3; Sodium 140; TSH 2.150  Recent Lipid Panel    Component Value Date/Time   CHOL 144 12/15/2021 1101   TRIG 57 12/15/2021 1101   HDL 55 12/15/2021 1101   CHOLHDL 2.6 12/15/2021 1101   CHOLHDL 4.0 09/17/2019 0014   VLDL 17 09/17/2019 0014    LDLCALC 77 12/15/2021 1101    Physical Exam:    VS:  BP (!) 154/68   Pulse 62   Ht 5\' 7"  (1.702 m)   Wt 173 lb (78.5 kg)   SpO2 96%   BMI 27.10 kg/m     Wt Readings from Last 3 Encounters:  09/13/22 173 lb (78.5 kg)  12/19/21 174 lb (78.9 kg)  12/15/21 174 lb 1.9 oz (79 kg)     GEN: Patient is in no acute distress HEENT: Normal NECK: No JVD; No carotid bruits LYMPHATICS: No lymphadenopathy CARDIAC: Hear sounds regular, 2/6 systolic murmur at the apex. RESPIRATORY:  Clear to auscultation without rales, wheezing or rhonchi  ABDOMEN: Soft, non-tender, non-distended MUSCULOSKELETAL:  No edema; No  deformity  SKIN: Warm and dry NEUROLOGIC:  Alert and oriented x 3 PSYCHIATRIC:  Normal affect   Signed, Jenean Lindau, MD  09/13/2022 11:36 AM    [place]

## 2022-09-13 NOTE — Patient Instructions (Signed)
Medication Instructions:  Your physician recommends that you continue on your current medications as directed. Please refer to the Current Medication list given to you today.  *If you need a refill on your cardiac medications before your next appointment, please call your pharmacy*   Lab Work: Your physician recommends that you have labs done in the office today. Your test included CMP and lipids.  [facility] [address] in [place]. They also close daily for lunch for 12-1.   [address] [address] 3rd floor Monday - Friday 8am to 11:30 and 1:00 to 4:00pm.  If you have labs (blood work) drawn today and your tests are completely normal, you will receive your results only by: [place] (if you have MyChart) OR A paper copy in the mail If you have any lab test that is abnormal or we need to change your treatment, we will call you to review the results.   Testing/Procedures: None ordered   Follow-Up: At [facility], you and your health needs are our priority.  As part of our continuing mission to provide you with exceptional heart care, we have created designated Provider Care Teams.  These Care Teams include your primary Cardiologist (physician) and Advanced Practice Providers (APPs -  Physician Assistants and Nurse Practitioners) who all work together to provide you with the care you need, when you need it.  We recommend signing up for the patient portal called "MyChart".  Sign up information is provided on this After Visit Summary.  MyChart is used to connect with patients for Virtual Visits (Telemedicine).  Patients are able to view lab/test results, encounter notes, upcoming appointments, etc.  Non-urgent messages can be sent to your provider as well.   To learn more about what you can do with MyChart, go to [email or web address].    Your next appointment:   9 month(s)  The format for your next appointment:   In Person  Provider:   Jyl Heinz, MD    Other Instructions none  Important Information About Sugar

## 2022-09-15 LAB — LIPID PANEL
Chol/HDL Ratio: 2.5 ratio (ref 0.0–4.4)
Cholesterol, Total: 135 mg/dL (ref 100–199)
HDL: 54 mg/dL (ref >39)
LDL Chol Calc (NIH): 69 mg/dL (ref 0–99)
Triglycerides: 55 mg/dL (ref 0–149)
VLDL Cholesterol Cal: 12 mg/dL (ref 5–40)

## 2022-09-15 LAB — COMPREHENSIVE METABOLIC PANEL
ALT: 15 IU/L (ref 0–32)
AST: 25 IU/L (ref 0–40)
Albumin/Globulin Ratio: 1.8 (ref 1.2–2.2)
Albumin: 4.6 g/dL (ref 3.8–4.8)
Alkaline Phosphatase: 83 IU/L (ref 44–121)
BUN/Creatinine Ratio: 22 (ref 12–28)
BUN: 21 mg/dL (ref 8–27)
Bilirubin Total: 0.8 mg/dL (ref 0.0–1.2)
CO2: 24 mmol/L (ref 20–29)
Calcium: 10.4 mg/dL — ABNORMAL HIGH (ref 8.7–10.3)
Chloride: 103 mmol/L (ref 96–106)
Creatinine, Ser: 0.96 mg/dL (ref 0.57–1.00)
Globulin, Total: 2.6 g/dL (ref 1.5–4.5)
Glucose: 94 mg/dL (ref 70–99)
Potassium: 4.8 mmol/L (ref 3.5–5.2)
Sodium: 143 mmol/L (ref 134–144)
Total Protein: 7.2 g/dL (ref 6.0–8.5)
eGFR: 61 mL/min/{1.73_m2} (ref >59)

## 2022-11-06 ENCOUNTER — Other Ambulatory Visit: Payer: Self-pay | Admitting: Cardiology

## 2022-11-06 DIAGNOSIS — I10 Essential (primary) hypertension: Secondary | ICD-10-CM

## 2022-11-06 DIAGNOSIS — I2510 Atherosclerotic heart disease of native coronary artery without angina pectoris: Secondary | ICD-10-CM

## 2022-12-09 ENCOUNTER — Other Ambulatory Visit: Payer: Self-pay | Admitting: Cardiology

## 2022-12-09 DIAGNOSIS — E782 Mixed hyperlipidemia: Secondary | ICD-10-CM

## 2023-01-09 ENCOUNTER — Telehealth: Payer: Self-pay | Admitting: Cardiology

## 2023-01-09 MED ORDER — TRIAMTERENE-HCTZ 37.5-25 MG PO TABS: 1.0000 | ORAL_TABLET | Freq: Every morning | ORAL | 2 refills | Status: DC

## 2023-01-09 NOTE — Telephone Encounter (Signed)
 RX sent

## 2023-01-09 NOTE — Telephone Encounter (Signed)
Pt c/o medication issue:  1. Name of Medication:   triamterene-hydrochlorothiazide (MAXZIDE-25) 37.5-25 MG tablet    2. How are you currently taking this medication (dosage and times per day)?   3. Are you having a reaction (difficulty breathing--STAT)?   4. What is your medication issue? Patient is calling to see if this medication could be called in to pharmacy. She states that the doctor that was prescribing before states this should be managed by her cardiologist.

## 2023-06-13 ENCOUNTER — Ambulatory Visit: Payer: Medicare PPO | Admitting: Cardiology

## 2023-07-05 ENCOUNTER — Ambulatory Visit: Payer: Medicare PPO | Attending: Cardiology | Admitting: Cardiology

## 2023-07-05 ENCOUNTER — Encounter: Payer: Self-pay | Admitting: Cardiology

## 2023-07-05 VITALS — BP 128/60 | HR 46 | Ht 67.0 in | Wt 178.0 lb

## 2023-07-05 DIAGNOSIS — I2510 Atherosclerotic heart disease of native coronary artery without angina pectoris: Secondary | ICD-10-CM

## 2023-07-05 DIAGNOSIS — I10 Essential (primary) hypertension: Secondary | ICD-10-CM | POA: Diagnosis not present

## 2023-07-05 DIAGNOSIS — E782 Mixed hyperlipidemia: Secondary | ICD-10-CM

## 2023-07-05 NOTE — Progress Notes (Signed)
 Cardiology Office Note:    Date:  07/05/2023   ID:  Shannon Hanson, DOB [birth year], MRN [id number]  PCP:  Norleen Nohemi Shuck, MD  Cardiologist:  Jennifer JONELLE Crape, MD   Referring MD: Norleen Nohemi Shuck, MD    ASSESSMENT:    1. Mild CAD   2. Essential hypertension   3. Mixed hyperlipidemia    PLAN:    In order of problems listed above:  Coronary artery disease: Secondary prevention stressed with the patient.  Importance of compliance with diet medication stressed and she vocalized understanding.  She was advised to walk at least half an hour a day and she promises to do so. Essential hypertension: Blood pressure is stable and diet was emphasized. Mixed dyslipidemia: On lipid-lowering medications followed by primary care.  Lipids reviewed and discussed with her at length. Patient will be seen in follow-up appointment in 6 months or earlier if the patient has any concerns.    Medication Adjustments/Labs and Tests Ordered: Current medicines are reviewed at length with the patient today.  Concerns regarding medicines are outlined above.  Orders Placed This Encounter  Procedures   EKG 12-Lead   No orders of the defined types were placed in this encounter.    No chief complaint on file.    History of Present Illness:    Shannon Hanson is a 78 y.o. female.  Patient has past medical history of mild coronary artery disease.  She denies any problems at this time and takes care of activities of daily living.  No chest pain orthopnea or PND.  She leads a sedentary lifestyle.  At the time of my evaluation, the patient is alert awake oriented and in no distress.  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 Left 4th toe   Hyperlipidemia 09/18/2019    Hypertension    Lumbar radiculopathy 01/27/2015   Mild CAD 10/02/2019   Nuclear sclerotic cataract of right eye 02/02/2014   Added automatically from request for surgery [id number]  Formatting of this note might be different from the original. Added automatically from request for surgery [id number]   Primary osteoarthritis of left knee 03/14/2014   Status post laparoscopic cholecystectomy 01/31/2018   Thoracic or lumbosacral neuritis or radiculitis, unspecified 02/02/2014    Past Surgical History:  Procedure Laterality Date   ABDOMINAL HYSTERECTOMY     CHOLECYSTECTOMY      Current Medications: Current Meds  Medication Sig   acetaminophen  (TYLENOL ) 500 MG tablet Take 1,000 mg by mouth every 6 (six) hours as needed for mild pain or headache.    amLODipine  (NORVASC ) 10 MG tablet Take 1 tablet (10 mg total) by mouth daily.   aspirin  EC 81 MG tablet Take 81 mg by mouth daily.   atorvastatin  (LIPITOR) 10 MG tablet Take 1 tablet (10 mg total) by mouth daily.   Calcium  Carbonate-Vitamin D  500-125 MG-UNIT TABS Take 2 tablets by mouth every evening.    FIBER COMPLETE PO Take 2 capsules by mouth in the morning and at bedtime.    fluticasone  (FLONASE ) 50 MCG/ACT nasal spray Place 1 spray into both nostrils daily as needed for allergies or rhinitis.    latanoprost (XALATAN) 0.005 % ophthalmic solution Place 1 drop into both eyes at bedtime.  loratadine  (CLARITIN ) 10 MG tablet Take 10 mg by mouth daily as needed for allergies or rhinitis.   nitroGLYCERIN  (NITROSTAT ) 0.4 MG SL tablet Place 1 tablet (0.4 mg total) under the tongue every 5 (five) minutes as needed for chest pain.   potassium chloride  (KLOR-CON ) 10 MEQ tablet Take 10 mEq by mouth daily.   triamcinolone cream (KENALOG) 0.1 % Apply 1 Application topically 2 (two) times daily.   triamterene -hydrochlorothiazide  (MAXZIDE -25) 37.5-25 MG tablet Take 1 tablet by mouth in the morning.     Allergies:   Ace inhibitors, Pregabalin, Shellfish allergy,  Gabapentin, Losartan , and Other   Social History   Socioeconomic History   Marital status: Married    Spouse name: Not on file   Number of children: Not on file   Years of education: Not on file   Highest education level: Not on file  Occupational History   Not on file  Tobacco Use   Smoking status: Never   Smokeless tobacco: Never  Vaping Use   Vaping status: Never Used  Substance and Sexual Activity   Alcohol use: No   Drug use: Never   Sexual activity: Not Currently  Other Topics Concern   Not on file  Social History Narrative   Lives with husband. Never smoker.  Retired financial risk analyst.     Social Drivers of Corporate Investment Banker Strain: Not on [employer] Insecurity: Not on file  Transportation Needs: Not on file  Physical Activity: Not on file  Stress: Not on file  Social Connections: Not on file     Family History: The patient's family history includes CAD (age of onset: 19) in her father; Heart disease (age of onset: 90) in her brother; Throat cancer in her brother and mother.  ROS:   Please see the history of present illness.    All other systems reviewed and are negative.  EKGs/Labs/Other Studies Reviewed:    The following studies were reviewed today: I discussed my findings with the patient at length  .SABRAEKG Interpretation Date/Time:  Thursday July 05 2023 09:18:00 EST Ventricular Rate:  46 PR Interval:    QRS Duration:  100 QT Interval:  496 QTC Calculation: 434 R Axis:   26  Text Interpretation: Junctional rhythm Septal infarct , age undetermined ST & T wave abnormality, consider inferior ischemia ST & T wave abnormality, consider anterolateral ischemia When compared with ECG of 16-Sep-2019 16:45, PREVIOUS ECG IS PRESENT Confirmed by Edwyna Backers [phone]) on 07/05/2023 9:37:36 AM     Recent Labs: 09/14/2022: ALT 15; BUN 21; Creatinine, Ser 0.96; Potassium 4.8; Sodium 143  Recent Lipid Panel    Component Value Date/Time   CHOL 135  09/14/2022 1054   TRIG 55 09/14/2022 1054   HDL 54 09/14/2022 1054   CHOLHDL 2.5 09/14/2022 1054   CHOLHDL 4.0 09/17/2019 0014   VLDL 17 09/17/2019 0014   LDLCALC 69 09/14/2022 1054    Physical Exam:    VS:  BP 128/60   Pulse (!) 46   Ht 5' 7 (1.702 m)   Wt 178 lb (80.7 kg)   SpO2 98%   BMI 27.88 kg/m     Wt Readings from Last 3 Encounters:  07/05/23 178 lb (80.7 kg)  09/13/22 173 lb (78.5 kg)  12/19/21 174 lb (78.9 kg)     GEN: Patient is in no acute distress HEENT: Normal NECK: No JVD; No carotid bruits LYMPHATICS: No lymphadenopathy CARDIAC: Hear sounds regular, 2/6 systolic murmur at the apex.  RESPIRATORY:  Clear to auscultation without rales, wheezing or rhonchi  ABDOMEN: Soft, non-tender, non-distended MUSCULOSKELETAL:  No edema; No deformity  SKIN: Warm and dry NEUROLOGIC:  Alert and oriented x 3 PSYCHIATRIC:  Normal affect   Signed, Jennifer JONELLE Crape, MD  07/05/2023 9:49 AM    [facility]

## 2023-07-05 NOTE — Patient Instructions (Signed)

## 2023-10-29 ENCOUNTER — Other Ambulatory Visit: Payer: Self-pay | Admitting: Cardiology

## 2023-10-29 DIAGNOSIS — I2510 Atherosclerotic heart disease of native coronary artery without angina pectoris: Secondary | ICD-10-CM

## 2023-10-29 DIAGNOSIS — I10 Essential (primary) hypertension: Secondary | ICD-10-CM

## 2023-11-03 ENCOUNTER — Other Ambulatory Visit: Payer: Self-pay | Admitting: Cardiology

## 2023-11-26 ENCOUNTER — Other Ambulatory Visit: Payer: Self-pay | Admitting: Cardiology

## 2023-11-26 DIAGNOSIS — E782 Mixed hyperlipidemia: Secondary | ICD-10-CM

## 2023-12-08 ENCOUNTER — Encounter: Payer: Self-pay | Admitting: Cardiology

## 2023-12-08 DIAGNOSIS — I2510 Atherosclerotic heart disease of native coronary artery without angina pectoris: Secondary | ICD-10-CM

## 2023-12-08 DIAGNOSIS — I10 Essential (primary) hypertension: Secondary | ICD-10-CM

## 2023-12-10 MED ORDER — AMLODIPINE BESYLATE 5 MG PO TABS: 5.0000 mg | ORAL_TABLET | Freq: Every day | ORAL | 3 refills | Status: DC

## 2023-12-10 NOTE — Addendum Note (Signed)
 Addended by: Einar Grave on: 12/10/2023 10:58 AM   Modules accepted: Orders

## 2024-05-19 ENCOUNTER — Telehealth: Payer: Self-pay | Admitting: Cardiology

## 2024-05-19 NOTE — Telephone Encounter (Signed)
 Pt c/o BP issue: STAT if pt c/o blurred vision, one-sided weakness or slurred speech.   1. What is your BP concern?  BP has been elevated the past 3 weeks.  2. Have you taken any BP medication today? No, patient says she normally takes BP medication around 11:00 AM.  3. What are your last 5 BP readings? 11/23 8:45 pm: 181/97 11/24: 159/81 this morning   4. Are you having any other symptoms (ex. Dizziness, headache, blurred vision, passed out)?  Swelling in feet

## 2024-05-19 NOTE — Telephone Encounter (Signed)
 Appointment made with Dr. Edwyna 05/21/24. Advised to take 10 mg Amlodipine  as her dose was decreased due to swelling which resolved. Pt advised to keep a BP log today and tomorrow 1-2 hours after medication and bring to appointment. Pt verbalized understanding and had no additional questions.

## 2024-05-21 ENCOUNTER — Encounter: Payer: Self-pay | Admitting: Cardiology

## 2024-05-21 ENCOUNTER — Ambulatory Visit: Attending: Cardiology | Admitting: Cardiology

## 2024-05-21 VITALS — BP 124/72 | HR 56 | Ht 66.0 in | Wt 177.0 lb

## 2024-05-21 DIAGNOSIS — I2510 Atherosclerotic heart disease of native coronary artery without angina pectoris: Secondary | ICD-10-CM

## 2024-05-21 DIAGNOSIS — I10 Essential (primary) hypertension: Secondary | ICD-10-CM

## 2024-05-21 DIAGNOSIS — E782 Mixed hyperlipidemia: Secondary | ICD-10-CM

## 2024-05-21 MED ORDER — AMLODIPINE BESYLATE 5 MG PO TABS: 10.0000 mg | ORAL_TABLET | Freq: Every day | ORAL | 3 refills | Status: DC

## 2024-05-21 NOTE — Patient Instructions (Signed)

## 2024-05-21 NOTE — Progress Notes (Signed)
 Cardiology Office Note:    Date:  05/21/2024   ID:  Shannon Hanson, DOB January 07, 1946, MRN [id number]  PCP:  Norleen Nohemi Shuck, MD  Cardiologist:  Jennifer JONELLE Crape, MD   Referring MD: Norleen Nohemi Shuck, MD    ASSESSMENT:    1. Essential hypertension   2. Mild CAD   3. Mixed hyperlipidemia    PLAN:    In order of problems listed above:  Coronary artery disease: Secondary prevention stressed with the patient.  Importance of compliance with diet medication stressed and patient verbalized standing.  Patient was advised to walk at least half an hour a day on a daily basis. Essential hypertension: Blood pressure is stable and diet was emphasized.  She brought multiple blood pressure readings from home and they are better with increasing amlodipine .  Lifestyle modification, salt intake issues and regular exercise was stressed relaxation techniques were discussed and she promises to do better. Mixed dyslipidemia: On lipid-lowering medications followed by primary care.  Goal LDL must be less than 60.  She is going for complete annual in the next few days.  She will send us  a copy of the reports. Patient will be seen in follow-up appointment in 9 months or earlier if the patient has any concerns.    Medication Adjustments/Labs and Tests Ordered: Current medicines are reviewed at length with the patient today.  Concerns regarding medicines are outlined above.  No orders of the defined types were placed in this encounter.  No orders of the defined types were placed in this encounter.    No chief complaint on file.    History of Present Illness:    Shannon Hanson is a 78 y.o. female.  Patient has past medical history of coronary artery disease mild in nature, essential hypertension and mixed dyslipidemia.  She denies any problems at this time and takes care of her activities of daily living.  No chest pain orthopnea or PND.  She tells me that she is under stressful situations at home and these are  getting better.  Her blood pressure was elevated and she has doubled her amlodipine  to 10 mg daily and blood pressures are under better control.  No chest pain orthopnea or PND.  She leads a sedentary lifestyle.  She is not exercising on a daily basis.  At the time of my evaluation, the patient is alert awake oriented and in no distress.  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 Left 4th toe   Hyperlipidemia 09/18/2019   Hypertension    Lumbar radiculopathy 01/27/2015   Mild CAD 10/02/2019   Nuclear sclerotic cataract of right eye 02/02/2014   Added automatically from request for surgery [id number]  Formatting of this note might be different from the original. Added automatically from request for surgery [id number]   Primary osteoarthritis of left knee 03/14/2014   Status post laparoscopic cholecystectomy 01/31/2018   Thoracic or lumbosacral neuritis or radiculitis, unspecified 02/02/2014    Past Surgical History:  Procedure Laterality Date   ABDOMINAL HYSTERECTOMY     CHOLECYSTECTOMY      Current Medications: Current Meds  Medication Sig   acetaminophen  (TYLENOL ) 500 MG tablet Take 1,000 mg by mouth every 6 (six) hours as needed for mild pain  or headache.    amLODipine  (NORVASC ) 5 MG tablet Take 1 tablet (5 mg total) by mouth daily.   aspirin  EC 81 MG tablet Take 81 mg by mouth daily.   atorvastatin  (LIPITOR) 10 MG tablet TAKE 1 TABLET(10 MG) BY MOUTH DAILY   Calcium  Carbonate-Vitamin D  500-125 MG-UNIT TABS Take 2 tablets by mouth every evening.    FIBER COMPLETE PO Take 2 capsules by mouth in the morning and at bedtime.    fluticasone  (FLONASE ) 50 MCG/ACT nasal spray Place 1 spray into both nostrils daily as needed for allergies or rhinitis.    latanoprost  (XALATAN) 0.005 % ophthalmic solution Place 1 drop into both eyes at bedtime.   loratadine  (CLARITIN ) 10 MG tablet Take 10 mg by mouth daily.   nitroGLYCERIN  (NITROSTAT ) 0.4 MG SL tablet Place 1 tablet (0.4 mg total) under the tongue every 5 (five) minutes as needed for chest pain.   potassium chloride  (KLOR-CON ) 10 MEQ tablet Take 10 mEq by mouth daily.   triamcinolone cream (KENALOG) 0.1 % Apply 1 Application topically 2 (two) times daily.   triamterene -hydrochlorothiazide  (MAXZIDE -25) 37.5-25 MG tablet TAKE 1 TABLET BY MOUTH IN THE MORNING     Allergies:   Ace inhibitors, Pregabalin, Shellfish allergy, Gabapentin, Losartan , and Other   Social History   Socioeconomic History   Marital status: Married    Spouse name: Not on file   Number of children: Not on file   Years of education: Not on file   Highest education level: Not on file  Occupational History   Not on file  Tobacco Use   Smoking status: Never   Smokeless tobacco: Never  Vaping Use   Vaping status: Never Used  Substance and Sexual Activity   Alcohol use: No   Drug use: Never   Sexual activity: Not Currently  Other Topics Concern   Not on file  Social History Narrative   Lives with husband. Never smoker.  Retired financial risk analyst.     Social Drivers of Corporate Investment Banker Strain: Not on [employer] Insecurity: Not on file  Transportation Needs: Not on file  Physical Activity: Not on file  Stress: Not on file  Social Connections: Not on file     Family History: The patient's family history includes CAD (age of onset: 73) in her father; Heart disease (age of onset: 76) in her brother; Throat cancer in her brother and mother.  ROS:   Please see the history of present illness.    All other systems reviewed and are negative.  EKGs/Labs/Other Studies Reviewed:    The following studies were reviewed today: .SABRA   I discussed my findings with the patient at length   Recent Labs: No results found for  requested labs within last 365 days.  Recent Lipid Panel    Component Value Date/Time   CHOL 135 09/14/2022 1054   TRIG 55 09/14/2022 1054   HDL 54 09/14/2022 1054   CHOLHDL 2.5 09/14/2022 1054   CHOLHDL 4.0 09/17/2019 0014   VLDL 17 09/17/2019 0014   LDLCALC 69 09/14/2022 1054    Physical Exam:    VS:  BP (!) 142/68   Pulse (!) 56   Ht 5' 6 (1.676 m)   Wt 177 lb (80.3 kg)   SpO2 97%   BMI 28.57 kg/m     Wt Readings from Last 3 Encounters:  05/21/24 177 lb (80.3 kg)  07/05/23 178 lb (80.7 kg)  09/13/22 173 lb (78.5 kg)  GEN: Patient is in no acute distress HEENT: Normal NECK: No JVD; No carotid bruits LYMPHATICS: No lymphadenopathy CARDIAC: Hear sounds regular, 2/6 systolic murmur at the apex. RESPIRATORY:  Clear to auscultation without rales, wheezing or rhonchi  ABDOMEN: Soft, non-tender, non-distended MUSCULOSKELETAL:  No edema; No deformity  SKIN: Warm and dry NEUROLOGIC:  Alert and oriented x 3 PSYCHIATRIC:  Normal affect   Signed, Jennifer JONELLE Crape, MD  05/21/2024 8:33 AM    [facility]

## 2024-05-21 NOTE — Addendum Note (Signed)
 Addended by: GLENFORD ALAN CROME on: 05/21/2024 08:59 AM   Modules accepted: Orders

## 2024-06-11 ENCOUNTER — Telehealth: Payer: Self-pay | Admitting: Cardiology

## 2024-06-11 DIAGNOSIS — I10 Essential (primary) hypertension: Secondary | ICD-10-CM

## 2024-06-11 NOTE — Telephone Encounter (Signed)
 Pt called in asking to speak with a nurse about her visit today with her PCP.

## 2024-06-11 NOTE — Telephone Encounter (Signed)
 Called the patient and she reported that she had her annual physical today and her blood pressure today was 152/72. Her PCP asked her to reach out to her cardiologist because at her last cardiology office visit her Amlodipine  was increased from 5 - 10 mg daily and it does not seem like the current dose of Amlodipine  is helping to control her blood pressure.Please advise.

## 2024-06-12 NOTE — Telephone Encounter (Signed)
 Recommendations reviewed with pt as per Dr. Kem Parkinson note.  Pt verbalized understanding and had no additional questions.

## 2024-06-20 NOTE — Telephone Encounter (Signed)
 Patient called to report her BP readings as requested.  12/19  BP 152/86  HR 56 12/20  BP 147/77  HR 44 12/21  BP 152/82  HR 55 12/22  BP 149/83  HR 50 12/23  BP 139/83  HR 51 12/24  BP 134/77  HR 55 12/25  BP 145/72  HR  53

## 2024-06-23 ENCOUNTER — Encounter: Payer: Self-pay | Admitting: Cardiology

## 2024-06-23 ENCOUNTER — Other Ambulatory Visit (HOSPITAL_BASED_OUTPATIENT_CLINIC_OR_DEPARTMENT_OTHER): Payer: Self-pay

## 2024-06-23 ENCOUNTER — Other Ambulatory Visit (HOSPITAL_COMMUNITY): Payer: Self-pay

## 2024-06-23 MED ORDER — SPIRONOLACTONE 25 MG PO TABS: 12.5000 mg | ORAL_TABLET | Freq: Every day | ORAL | 3 refills | Status: AC

## 2024-06-23 MED ORDER — SPIRONOLACTONE 25 MG PO TABS
12.5000 mg | ORAL_TABLET | Freq: Every day | ORAL | 3 refills | Status: DC
  Filled 2024-06-23: qty 45, 90d supply, fill #0

## 2024-06-23 NOTE — Telephone Encounter (Signed)
 Recommendations reviewed with pt as per Dr. Posey note.  Pt verbalized understanding and had no additional questions.   Pt to hold potassium until repeat labs.

## 2024-06-23 NOTE — Addendum Note (Signed)
 Addended by: ONEITA BERLINER on: 06/23/2024 08:29 AM   Modules accepted: Orders

## 2024-06-30 DIAGNOSIS — I10 Essential (primary) hypertension: Secondary | ICD-10-CM

## 2024-07-01 ENCOUNTER — Telehealth: Payer: Self-pay

## 2024-07-01 DIAGNOSIS — I10 Essential (primary) hypertension: Secondary | ICD-10-CM

## 2024-07-01 NOTE — Telephone Encounter (Signed)
 BMP per Dr. Revankar

## 2024-07-02 ENCOUNTER — Ambulatory Visit: Payer: Self-pay | Admitting: Cardiology

## 2024-07-02 LAB — BASIC METABOLIC PANEL WITH GFR
BUN/Creatinine Ratio: 20 (ref 12–28)
BUN: 19 mg/dL (ref 8–27)
CO2: 25 mmol/L (ref 20–29)
Calcium: 10.1 mg/dL (ref 8.7–10.3)
Chloride: 102 mmol/L (ref 96–106)
Creatinine, Ser: 0.94 mg/dL (ref 0.57–1.00)
Glucose: 93 mg/dL (ref 70–99)
Potassium: 4.9 mmol/L (ref 3.5–5.2)
Sodium: 142 mmol/L (ref 134–144)
eGFR: 62 mL/min/1.73

## 2024-07-23 NOTE — Progress Notes (Unsigned)
 "  [facility] Initial Assessment:    Date:  07/24/2024   ID:  Shannon Hanson, DOB May 06, 1946, MRN [id number]  PCP:  Shannon Nohemi Shuck, MD  Cardiologist:  Shannon JONELLE Crape, MD  Nephrologist:  Referring MD: Hanson Shannon JONELLE, MD   CC: Hypertension  History of Present Illness:    Shannon Hanson is a 79 y.o. female with a hx of CAD (08/2019 CCTA with calcium  score 32 with 0-25% stenosis of RCA), HTN, HLD here to establish care in the [facility]. Referred by Dr. Crape.   Prior renal duplex 2020 with no renal artery stenosis.   Referred by her primary cardiologist Dr. Crape after BP elevated by home readings.  Shannon Hanson was diagnosed with hypertension in her 25s. Blood pressure checked with arm cuff at home.   she reports tobacco use never. Alcohol use never.  she eats at home 80% of the time and outside of the home and does follow low sodium diet. She avoid caffeine. Prior to 09/2023 was going to exercise 3 x per week. However  her husband had an orthopedic injury requiring short term SNF stay then outpatient PT necessitating her taking him to multiple appointments, limiting time for exercise. Plans to return to her previous regimen. Discussed her BP may be elevated due to interruption of exercise routine. Reports when her blood pressure is high she feels poorly. Describes it as feeling anticipatory. This is improved since adding Spironolactone .   BP range from 06/24/24-06/30/24 with BP 122/72-158/80. Overall reports BP most often <130/80 when checked a few times per week. Occasional readings in 140s.    Previous antihypertensives: ACE/ARB (Lisinopril, Losartan  - lip swelling, itching) Clonidine Metoprolol   Secondary Causes of Hypertension  Medications/Herbal: OCP, steroids, stimulants, antidepressants, weight loss medication, immune suppressants, NSAIDs, sympathomimetics, alcohol, caffeine, licorice, ginseng, St. John's wort, chemo  Sleep  Apnea Renal artery stenosis Hyperaldosteronism Hyper/hypothyroidism Pheochromocytoma: palpitations, tachycardia, headache, diaphoresis (plasma metanephrines) Cushing's syndrome: Cushingoid facies, central obesity, proximal muscle weakness, and ecchymoses, adrenal incidentaloma (cortisol) Coarctation of the aorta  Past Medical History:  Diagnosis Date   Chest pain 08/2019   Chest pain of uncertain etiology 09/16/2019   Elevated troponin 09/18/2019   Essential hypertension 07/18/2019   Hammer toe 02/02/2014   Overview:  Left 4th toe  Formatting of this note might be different from the original. Left 4th toe   Hyperlipidemia 09/18/2019   Hypertension    Lumbar radiculopathy 01/27/2015   Mild CAD 10/02/2019   Nuclear sclerotic cataract of right eye 02/02/2014   Added automatically from request for surgery [id number]  Formatting of this note might be different from the original. Added automatically from request for surgery [id number]   Primary osteoarthritis of left knee 03/14/2014   Status post laparoscopic cholecystectomy 01/31/2018   Thoracic or lumbosacral neuritis or radiculitis, unspecified 02/02/2014    Past Surgical History:  Procedure Laterality Date   ABDOMINAL HYSTERECTOMY     CHOLECYSTECTOMY      Current Medications: Active Medications[1]   Allergies:   Ace inhibitors, Pregabalin, Shellfish allergy, Gabapentin, Losartan , and Other   Social History   Socioeconomic History   Marital status: Married    Spouse name: Not on file   Number of children: Not on file   Years of education: Not on file   Highest education level: Not on file  Occupational History   Not on file  Tobacco Use   Smoking status: Never   Smokeless tobacco: Never  Vaping Use  Vaping status: Never Used  Substance and Sexual Activity   Alcohol use: No   Drug use: Never   Sexual activity: Not Currently  Other Topics Concern   Not on file  Social History Narrative   Lives with husband. Never smoker.  Retired  school principal.     Social Drivers of Health   Tobacco Use: Low Risk (07/24/2024)   Patient History    Smoking Tobacco Use: Never    Smokeless Tobacco Use: Never    Passive Exposure: Not on file  Financial Resource Strain: Low Risk (07/24/2024)   Overall Financial Resource Strain (CARDIA)    Difficulty of Paying Living Expenses: Not hard at all  Food Insecurity: No Food Insecurity (07/24/2024)   Epic    Worried About Programme Researcher, Broadcasting/film/video in the Last Year: Never true    Ran Out of Food in the Last Year: Never true  Transportation Needs: No Transportation Needs (07/24/2024)   Epic    Lack of Transportation (Medical): No    Lack of Transportation (Non-Medical): No  Physical Activity: Insufficiently Active (07/24/2024)   Exercise Vital Sign    Days of Exercise per Week: 1 day    Minutes of Exercise per Session: 30 min  Stress: No Stress Concern Present (07/24/2024)   [employer] of Occupational Health - Occupational Stress Questionnaire    Feeling of Stress: Not at all  Social Connections: Socially Integrated (07/24/2024)   Social Connection and Isolation Panel    Frequency of Communication with Friends and Family: More than three times a week    Frequency of Social Gatherings with Friends and Family: Twice a week    Attends Religious Services: More than 4 times per year    Active Member of Clubs or Organizations: Yes    Attends Banker Meetings: More than 4 times per year    Marital Status: Married  Depression (PHQ2-9): Low Risk (07/24/2024)   Depression (PHQ2-9)    PHQ-2 Score: 0  Alcohol Screen: Low Risk (07/24/2024)   Alcohol Screen    Last Alcohol Screening Score (AUDIT): 0  Housing: Low Risk (07/24/2024)   Epic    Unable to Pay for Housing in the Last Year: No    Number of Times Moved in the Last Year: 0    Homeless in the Last Year: No  Utilities: Not At Risk (07/24/2024)   Epic    Threatened with loss of utilities: No  Health Literacy: Adequate Health  Literacy (07/24/2024)   B1300 Health Literacy    Frequency of need for help with medical instructions: Never     Family History: The patient's family history includes CAD (age of onset: 93) in her father; Heart disease (age of onset: 66) in her brother; Hypertension in her father and mother; Throat cancer in her brother and mother.  ROS:   Please see the history of present illness.     All other systems reviewed and are negative.  EKGs/Labs/Other Studies Reviewed:         Recent Labs: 07/01/2024: BUN 19; Creatinine, Ser 0.94; Potassium 4.9; Sodium 142   Recent Lipid Panel    Component Value Date/Time   CHOL 135 09/14/2022 1054   TRIG 55 09/14/2022 1054   HDL 54 09/14/2022 1054   CHOLHDL 2.5 09/14/2022 1054   CHOLHDL 4.0 09/17/2019 0014   VLDL 17 09/17/2019 0014   LDLCALC 69 09/14/2022 1054    Physical Exam:   VS:  BP 130/73 (BP Location: Left Arm)  Pulse (!) 50   Ht 5' 6 (1.676 m)   Wt 178 lb 11.2 oz (81.1 kg)   SpO2 99%   BMI 28.84 kg/m  , BMI Body mass index is 28.84 kg/m.  Vitals:   07/24/24 1133 07/24/24 1136  BP: 121/71 130/73  Pulse: (!) 53 (!) 50  Height: 5' 6 (1.676 m)   Weight: 178 lb 11.2 oz (81.1 kg)   SpO2: 99%   BMI (Calculated): 28.86     GENERAL:  Well appearing HEENT: Pupils equal round and reactive, fundi not visualized, oral mucosa unremarkable NECK:  No jugular venous distention, waveform within normal limits, carotid upstroke brisk and symmetric, no bruits, no thyromegaly LYMPHATICS:  No cervical adenopathy LUNGS:  Clear to auscultation bilaterally HEART:  RRR.  PMI not displaced or sustained,S1 and S2 within normal limits, no S3, no S4, no clicks, no rubs, no murmurs ABD:  Flat, positive bowel sounds normal in frequency in pitch, no bruits, no rebound, no guarding, no midline pulsatile mass, no hepatomegaly, no splenomegaly EXT:  2 plus pulses throughout, no edema, no cyanosis no clubbing SKIN:  No rashes no nodules NEURO:  Cranial  nerves II through XII grossly intact, motor grossly intact throughout PSYCH:  Cognitively intact, oriented to person place and time   ASSESSMENT/PLAN:    HTN - BP well controlled.  Continue present antihypertensive regimen amlodipine  10 mg daily, spironolactone  12.5 mg daily, triamterene -HCTZ 37.5-25 mg daily.-STM is normalized and starting spironolactone , she will remain off potassium supplement. Secondary hypertension workup 06/10/24 TSH unremarkable. No snoring, daytime somnolence obstructive sleep apnea.   2020 renal duplex with no stenosis Encouraged to return to regular exercise regimen, she plans to return to her previous routine 3x per week. Encouraged to continue low sodium diet.   Nonobstructive CAD / HLD, LDL goal <70 - Stable with no anginal symptoms. No indication for ischemic evaluation.  GDMT aspirin  81 mg daily, atorvastatin  10 mg daily.  No beta-blocker due to baseline hypotension.  06/10/24 LDL 64. Recommend aiming for 150 minutes of moderate intensity activity per week and following a heart healthy diet.    Sinus bradycardia - Longstanding history. She is asymptomatic with no lightheadedness, dizziness. Avoid AV nodal blocking agents.   Screening for Secondary Hypertension:     Relevant Labs/Studies:    Latest Ref Rng & Units 07/01/2024   10:36 AM 09/14/2022   10:54 AM 12/15/2021   11:01 AM  Basic Labs  Sodium 134 - 144 mmol/L 142  143  140   Potassium 3.5 - 5.2 mmol/L 4.9  4.8  4.3   Creatinine 0.57 - 1.00 mg/dL 9.05  9.03  9.07        Latest Ref Rng & Units 12/15/2021   11:01 AM 05/11/2021   11:38 AM  Thyroid   TSH 0.450 - 4.500 uIU/mL 2.150  1.930                 06/26/2019    8:14 AM  Renovascular   Renal Artery US  Completed Yes    Disposition:    FU with [facility] PRN follow up with Dr. Revankar 11/2024   Medication Adjustments/Labs and Tests Ordered: Current medicines are reviewed at length with the patient today.  Concerns  regarding medicines are outlined above.  No orders of the defined types were placed in this encounter.  No orders of the defined types were placed in this encounter.    Signed, Reche GORMAN Finder, NP  07/24/2024 12:11 PM  [facility]     [1]  Current Meds  Medication Sig   acetaminophen  (TYLENOL ) 500 MG tablet Take 1,000 mg by mouth every 6 (six) hours as needed for mild pain or headache.    amLODipine  (NORVASC ) 5 MG tablet Take 2 tablets (10 mg total) by mouth daily.   aspirin  EC 81 MG tablet Take 81 mg by mouth daily.   atorvastatin  (LIPITOR) 10 MG tablet TAKE 1 TABLET(10 MG) BY MOUTH DAILY   Calcium  Carbonate-Vitamin D  500-125 MG-UNIT TABS Take 2 tablets by mouth every evening.  (Patient taking differently: Take 2 tablets by mouth in the morning and at bedtime. 600 mg)   Cholecalciferol (VITAMIN D3) 50 MCG (2000 UT) capsule Take 2,000 Units by mouth daily.   diclofenac (VOLTAREN) 50 MG EC tablet Take 50 mg by mouth. As needed   FIBER COMPLETE PO Take 2 capsules by mouth in the morning and at bedtime.    fluticasone  (FLONASE ) 50 MCG/ACT nasal spray Place 1 spray into both nostrils daily as needed for allergies or rhinitis.    latanoprost (XALATAN) 0.005 % ophthalmic solution Place 1 drop into both eyes at bedtime.   loratadine  (CLARITIN ) 10 MG tablet Take 10 mg by mouth daily.   nitroGLYCERIN  (NITROSTAT ) 0.4 MG SL tablet Place 1 tablet (0.4 mg total) under the tongue every 5 (five) minutes as needed for chest pain.   spironolactone  (ALDACTONE ) 25 MG tablet Take 1/2 tablet (12.5 mg total) by mouth daily.   timolol  (TIMOPTIC ) 0.5 % ophthalmic solution Place 1 drop into the left eye every morning.   triamcinolone cream (KENALOG) 0.1 % Apply 1 Application topically 2 (two) times daily.   triamterene -hydrochlorothiazide  (MAXZIDE -25) 37.5-25 MG tablet TAKE 1 TABLET BY MOUTH IN THE MORNING   "

## 2024-07-24 ENCOUNTER — Encounter (HOSPITAL_BASED_OUTPATIENT_CLINIC_OR_DEPARTMENT_OTHER): Payer: Self-pay | Admitting: Family

## 2024-07-24 ENCOUNTER — Ambulatory Visit (HOSPITAL_BASED_OUTPATIENT_CLINIC_OR_DEPARTMENT_OTHER): Admitting: Family

## 2024-07-24 VITALS — BP 130/73 | HR 50 | Ht 66.0 in | Wt 178.7 lb

## 2024-07-24 DIAGNOSIS — E785 Hyperlipidemia, unspecified: Secondary | ICD-10-CM

## 2024-07-24 DIAGNOSIS — I10 Essential (primary) hypertension: Secondary | ICD-10-CM | POA: Diagnosis not present

## 2024-07-24 DIAGNOSIS — I2510 Atherosclerotic heart disease of native coronary artery without angina pectoris: Secondary | ICD-10-CM | POA: Diagnosis not present

## 2024-07-24 MED ORDER — AMLODIPINE BESYLATE 10 MG PO TABS: 10.0000 mg | ORAL_TABLET | Freq: Every day | ORAL | 3 refills | Status: AC

## 2024-07-24 NOTE — Patient Instructions (Signed)
 Medication Instructions:  Continue your current medications.    *If you need a refill on your cardiac medications before your next appointment, please call your pharmacy*  Lab Work: Your cholesterol in December looked great!  Follow-Up: At [facility], you and your health needs are our priority.  As part of our continuing mission to provide you with exceptional heart care, our providers are all part of one team.  This team includes your primary Cardiologist (physician) and Advanced Practice Providers or APPs (Physician Assistants and Nurse Practitioners) who all work together to provide you with the care you need, when you need it.  Your next appointment:   In June as scheduled with Dr. Edwyna  We recommend signing up for the patient portal called MyChart.  Sign up information is provided on this After Visit Summary.  MyChart is used to connect with patients for Virtual Visits (Telemedicine).  Patients are able to view lab/test results, encounter notes, upcoming appointments, etc.  Non-urgent messages can be sent to your provider as well.   To learn more about what you can do with MyChart, go to [email or web address].   Other Instructions  Our goal is for your blood pressure on average to be less than 130/80.  If it is consistently less than 120/80, we could consider reducing your blood pressure regimen.   Check blood pressure 3 times per week. If it is more than 130/80 most of the time, please let us  know.

## 2024-12-11 ENCOUNTER — Ambulatory Visit: Admitting: Cardiology
